# Patient Record
Sex: Male | Born: 1935 | Race: White | Hispanic: No | Marital: Married
Health system: Southern US, Community
[De-identification: ages and names within clinical notes are randomized; demographics above are authoritative.]

---

## 2006-02-08 ENCOUNTER — Emergency Department (HOSPITAL_COMMUNITY): Admission: EM | Admit: 2006-02-08 | Discharge: 2006-02-09 | Payer: Self-pay | Admitting: Emergency Medicine

## 2006-02-16 ENCOUNTER — Ambulatory Visit: Payer: Self-pay | Admitting: Urology

## 2006-02-16 ENCOUNTER — Other Ambulatory Visit: Payer: Self-pay

## 2006-02-17 ENCOUNTER — Ambulatory Visit: Payer: Self-pay | Admitting: Urology

## 2011-10-22 ENCOUNTER — Ambulatory Visit: Payer: Self-pay | Admitting: Cardiology

## 2011-10-22 LAB — PROTIME-INR: Prothrombin Time: 20.2 secs — ABNORMAL HIGH (ref 11.5–14.7)

## 2011-11-22 ENCOUNTER — Observation Stay: Payer: Self-pay | Admitting: Cardiology

## 2011-11-22 LAB — CBC WITH DIFFERENTIAL/PLATELET
Basophil #: 0.1 x10 3/mm 3
Basophil %: 0.7 %
Eosinophil #: 0.1 x10 3/mm 3
Eosinophil %: 0.9 %
HCT: 38.3 % — ABNORMAL LOW
HGB: 12.7 g/dL — ABNORMAL LOW
Lymphocyte %: 20.7 %
Lymphs Abs: 1.6 x10 3/mm 3
MCH: 26.8 pg
MCHC: 33.2 g/dL
MCV: 81 fL
Monocyte #: 0.8 "x10 3/mm "
Monocyte %: 10.3 %
Neutrophil #: 5.3 x10 3/mm 3
Neutrophil %: 67.4 %
Platelet: 144 x10 3/mm 3 — ABNORMAL LOW
RBC: 4.74 x10 6/mm 3
RDW: 15.5 % — ABNORMAL HIGH
WBC: 7.9 x10 3/mm 3

## 2011-11-22 LAB — BASIC METABOLIC PANEL
Calcium, Total: 9.2 mg/dL (ref 8.5–10.1)
Co2: 33 mmol/L — ABNORMAL HIGH (ref 21–32)
Creatinine: 1.37 mg/dL — ABNORMAL HIGH (ref 0.60–1.30)
EGFR (African American): 57 — ABNORMAL LOW
EGFR (Non-African Amer.): 49 — ABNORMAL LOW
Glucose: 136 mg/dL — ABNORMAL HIGH (ref 65–99)
Potassium: 3.5 mmol/L (ref 3.5–5.1)
Sodium: 140 mmol/L (ref 136–145)

## 2011-11-22 LAB — PROTIME-INR: Prothrombin Time: 24.3 secs — ABNORMAL HIGH (ref 11.5–14.7)

## 2011-11-23 LAB — BASIC METABOLIC PANEL
Anion Gap: 9 (ref 7–16)
BUN: 24 mg/dL — ABNORMAL HIGH (ref 7–18)
Chloride: 103 mmol/L (ref 98–107)
Creatinine: 1.24 mg/dL (ref 0.60–1.30)
EGFR (African American): >60
EGFR (Non-African Amer.): 55 — ABNORMAL LOW

## 2011-11-23 LAB — PROTIME-INR: INR: 2.3

## 2012-04-29 ENCOUNTER — Ambulatory Visit: Payer: Self-pay | Admitting: Oncology

## 2012-05-16 ENCOUNTER — Ambulatory Visit: Payer: Self-pay | Admitting: Urology

## 2012-05-16 LAB — PROTIME-INR: INR: 1.3

## 2012-05-18 ENCOUNTER — Ambulatory Visit: Payer: Self-pay | Admitting: Urology

## 2012-05-18 LAB — PROTIME-INR: INR: 1.1

## 2012-05-19 ENCOUNTER — Inpatient Hospital Stay: Payer: Self-pay

## 2012-05-19 LAB — URINALYSIS, COMPLETE
Glucose,UR: NEGATIVE mg/dL (ref 0–75)
Ketone: NEGATIVE
Protein: 100
Specific Gravity: 1.013 (ref 1.003–1.030)

## 2012-05-19 LAB — COMPREHENSIVE METABOLIC PANEL
Alkaline Phosphatase: 69 U/L (ref 50–136)
Anion Gap: 9 (ref 7–16)
BUN: 26 mg/dL — ABNORMAL HIGH (ref 7–18)
Bilirubin,Total: 1.1 mg/dL — ABNORMAL HIGH (ref 0.2–1.0)
Calcium, Total: 8.3 mg/dL — ABNORMAL LOW (ref 8.5–10.1)
Co2: 26 mmol/L (ref 21–32)
EGFR (African American): 26 — ABNORMAL LOW
EGFR (Non-African Amer.): 22 — ABNORMAL LOW
SGPT (ALT): 16 U/L (ref 12–78)
Total Protein: 6.2 g/dL — ABNORMAL LOW (ref 6.4–8.2)

## 2012-05-19 LAB — CBC WITH DIFFERENTIAL/PLATELET
Basophil #: 0 10*3/uL (ref 0.0–0.1)
Basophil %: 0.1 %
Eosinophil #: 0 10*3/uL (ref 0.0–0.7)
HCT: 31.6 % — ABNORMAL LOW (ref 40.0–52.0)
Lymphocyte #: 1.3 10*3/uL (ref 1.0–3.6)
MCH: 29.6 pg (ref 26.0–34.0)
Monocyte %: 7.2 %
Neutrophil %: 82.9 %
RDW: 14.3 % (ref 11.5–14.5)

## 2012-05-19 LAB — PROTIME-INR: Prothrombin Time: 15.5 secs — ABNORMAL HIGH (ref 11.5–14.7)

## 2012-05-19 LAB — PHOSPHORUS: Phosphorus: 4 mg/dL (ref 2.5–4.9)

## 2012-05-20 ENCOUNTER — Ambulatory Visit: Payer: Self-pay | Admitting: Neurology

## 2012-05-20 ENCOUNTER — Ambulatory Visit: Payer: Self-pay

## 2012-05-20 LAB — BASIC METABOLIC PANEL
Anion Gap: 9 (ref 7–16)
Chloride: 101 mmol/L (ref 98–107)
Creatinine: 3.3 mg/dL — ABNORMAL HIGH (ref 0.60–1.30)
EGFR (African American): 20 — ABNORMAL LOW
Sodium: 133 mmol/L — ABNORMAL LOW (ref 136–145)

## 2012-05-20 LAB — CBC WITH DIFFERENTIAL/PLATELET
Eosinophil %: 0.3 %
HCT: 23.6 % — ABNORMAL LOW (ref 40.0–52.0)
HGB: 7.9 g/dL — ABNORMAL LOW (ref 13.0–18.0)
Lymphocyte %: 8.6 %
MCH: 28.9 pg (ref 26.0–34.0)
MCHC: 33.3 g/dL (ref 32.0–36.0)
MCV: 87 fL (ref 80–100)
RBC: 2.72 10*6/uL — ABNORMAL LOW (ref 4.40–5.90)
WBC: 14 10*3/uL — ABNORMAL HIGH (ref 3.8–10.6)

## 2012-05-20 LAB — URINE CULTURE

## 2012-05-20 LAB — HEMATOCRIT: HCT: 20.5 % — ABNORMAL LOW (ref 40.0–52.0)

## 2012-05-21 LAB — BASIC METABOLIC PANEL
Anion Gap: 6 — ABNORMAL LOW (ref 7–16)
Calcium, Total: 7.9 mg/dL — ABNORMAL LOW (ref 8.5–10.1)
Chloride: 104 mmol/L (ref 98–107)
Co2: 26 mmol/L (ref 21–32)
Creatinine: 2.45 mg/dL — ABNORMAL HIGH (ref 0.60–1.30)
EGFR (African American): 28 — ABNORMAL LOW
EGFR (Non-African Amer.): 24 — ABNORMAL LOW
Glucose: 110 mg/dL — ABNORMAL HIGH (ref 65–99)
Potassium: 4.3 mmol/L (ref 3.5–5.1)

## 2012-05-21 LAB — CBC WITH DIFFERENTIAL/PLATELET
Basophil #: 0 10*3/uL (ref 0.0–0.1)
Basophil %: 0.1 %
Eosinophil #: 0 10*3/uL (ref 0.0–0.7)
Eosinophil %: 0 %
HCT: 22.9 % — ABNORMAL LOW (ref 40.0–52.0)
Lymphocyte %: 7.7 %
MCH: 29.7 pg (ref 26.0–34.0)
MCV: 87 fL (ref 80–100)
Monocyte #: 1.7 x10 3/mm — ABNORMAL HIGH (ref 0.2–1.0)
Monocyte %: 11.4 %
Neutrophil #: 12.4 10*3/uL — ABNORMAL HIGH (ref 1.4–6.5)
Platelet: 86 10*3/uL — ABNORMAL LOW (ref 150–440)
RBC: 2.65 10*6/uL — ABNORMAL LOW (ref 4.40–5.90)
WBC: 15.3 10*3/uL — ABNORMAL HIGH (ref 3.8–10.6)

## 2012-05-21 LAB — PROTIME-INR: Prothrombin Time: 16.8 secs — ABNORMAL HIGH (ref 11.5–14.7)

## 2012-05-22 LAB — HEPATIC FUNCTION PANEL A (ARMC)
Albumin: 2.7 g/dL — ABNORMAL LOW (ref 3.4–5.0)
Bilirubin,Total: 1.6 mg/dL — ABNORMAL HIGH (ref 0.2–1.0)
SGOT(AST): 46 U/L — ABNORMAL HIGH (ref 15–37)
SGPT (ALT): 26 U/L (ref 12–78)
Total Protein: 6 g/dL — ABNORMAL LOW (ref 6.4–8.2)

## 2012-05-22 LAB — BASIC METABOLIC PANEL
Anion Gap: 5 — ABNORMAL LOW (ref 7–16)
BUN: 32 mg/dL — ABNORMAL HIGH (ref 7–18)
Calcium, Total: 8.1 mg/dL — ABNORMAL LOW (ref 8.5–10.1)
EGFR (Non-African Amer.): 32 — ABNORMAL LOW
Osmolality: 280 (ref 275–301)
Potassium: 4.5 mmol/L (ref 3.5–5.1)

## 2012-05-22 LAB — PROTIME-INR: Prothrombin Time: 16.5 secs — ABNORMAL HIGH (ref 11.5–14.7)

## 2012-05-22 LAB — CBC WITH DIFFERENTIAL/PLATELET
Basophil #: 0 10*3/uL (ref 0.0–0.1)
Basophil %: 0.1 %
Eosinophil #: 0 10*3/uL (ref 0.0–0.7)
Eosinophil %: 0 %
HGB: 8.1 g/dL — ABNORMAL LOW (ref 13.0–18.0)
HGB: 7.3 g/dL — ABNORMAL LOW (ref 13.0–18.0)
Lymphocyte %: 6.3 %
Lymphocyte %: 7.3 %
MCH: 31.3 pg (ref 26.0–34.0)
MCH: 29.3 pg (ref 26.0–34.0)
MCHC: 35.9 g/dL (ref 32.0–36.0)
MCHC: 33.5 g/dL (ref 32.0–36.0)
MCV: 87 fL (ref 80–100)
Monocyte %: 9.9 %
Monocyte %: 9.6 %
Neutrophil %: 83 %
Platelet: 114 10*3/uL — ABNORMAL LOW (ref 150–440)
RBC: 2.59 10*6/uL — ABNORMAL LOW (ref 4.40–5.90)
RBC: 2.5 10*6/uL — ABNORMAL LOW (ref 4.40–5.90)
RDW: 14.5 % (ref 11.5–14.5)

## 2012-05-22 LAB — IRON AND TIBC
Iron Saturation: 5 %
Unbound Iron-Bind.Cap.: 206 ug/dL

## 2012-05-22 LAB — FERRITIN: Ferritin (ARMC): 246 ng/mL (ref 8–388)

## 2012-05-22 LAB — FIBRIN DEGRADATION PROD.(ARMC ONLY): Fibrin Degradation Prod.: >10 ug/ml — ABNORMAL HIGH (ref 2.1–7.7)

## 2012-05-22 LAB — PROTEIN ELECTROPHORESIS(ARMC)

## 2012-05-22 LAB — LACTATE DEHYDROGENASE: LDH: 441 U/L — ABNORMAL HIGH (ref 85–241)

## 2012-05-23 LAB — CBC WITH DIFFERENTIAL/PLATELET
Basophil #: 0 10*3/uL (ref 0.0–0.1)
Eosinophil #: 0 10*3/uL (ref 0.0–0.7)
HGB: 8.6 g/dL — ABNORMAL LOW (ref 13.0–18.0)
Lymphocyte #: 1.4 10*3/uL (ref 1.0–3.6)
Lymphocyte %: 9.1 %
MCH: 29.8 pg (ref 26.0–34.0)
Neutrophil #: 12.5 10*3/uL — ABNORMAL HIGH (ref 1.4–6.5)
Neutrophil %: 80.3 %
Platelet: 118 10*3/uL — ABNORMAL LOW (ref 150–440)
RDW: 14.5 % (ref 11.5–14.5)
WBC: 15.6 10*3/uL — ABNORMAL HIGH (ref 3.8–10.6)

## 2012-05-23 LAB — PROTIME-INR
INR: 1.3
Prothrombin Time: 16.5 secs — ABNORMAL HIGH (ref 11.5–14.7)

## 2012-05-23 LAB — BASIC METABOLIC PANEL
Anion Gap: 4 — ABNORMAL LOW (ref 7–16)
BUN: 33 mg/dL — ABNORMAL HIGH (ref 7–18)
Chloride: 107 mmol/L (ref 98–107)
Osmolality: 285 (ref 275–301)
Sodium: 139 mmol/L (ref 136–145)

## 2012-05-23 LAB — HEMOGLOBIN: HGB: 9 g/dL — ABNORMAL LOW (ref 13.0–18.0)

## 2012-05-24 LAB — BASIC METABOLIC PANEL
Anion Gap: 3 — ABNORMAL LOW (ref 7–16)
Calcium, Total: 8.3 mg/dL — ABNORMAL LOW (ref 8.5–10.1)
Chloride: 109 mmol/L — ABNORMAL HIGH (ref 98–107)
Co2: 30 mmol/L (ref 21–32)
Creatinine: 1.69 mg/dL — ABNORMAL HIGH (ref 0.60–1.30)
EGFR (African American): 44 — ABNORMAL LOW
Glucose: 119 mg/dL — ABNORMAL HIGH (ref 65–99)
Potassium: 4.2 mmol/L (ref 3.5–5.1)

## 2012-05-24 LAB — CBC WITH DIFFERENTIAL/PLATELET
Basophil #: 0.1 10*3/uL (ref 0.0–0.1)
Eosinophil #: 0.1 10*3/uL (ref 0.0–0.7)
Eosinophil %: 0.8 %
HGB: 8.4 g/dL — ABNORMAL LOW (ref 13.0–18.0)
MCH: 29.4 pg (ref 26.0–34.0)
MCHC: 33.3 g/dL (ref 32.0–36.0)
Monocyte %: 9.8 %
Neutrophil %: 79.1 %
RBC: 2.87 10*6/uL — ABNORMAL LOW (ref 4.40–5.90)
WBC: 13.9 10*3/uL — ABNORMAL HIGH (ref 3.8–10.6)

## 2012-05-24 LAB — CULTURE, BLOOD (SINGLE)

## 2012-05-24 LAB — HEMOGLOBIN: HGB: 9.7 g/dL — ABNORMAL LOW (ref 13.0–18.0)

## 2012-05-25 LAB — HEMOGLOBIN: HGB: 9.1 g/dL — ABNORMAL LOW (ref 13.0–18.0)

## 2012-05-25 LAB — CBC WITH DIFFERENTIAL/PLATELET
Basophil #: 0 10*3/uL (ref 0.0–0.1)
Eosinophil %: 0 %
HCT: 26 % — ABNORMAL LOW (ref 40.0–52.0)
HGB: 8.6 g/dL — ABNORMAL LOW (ref 13.0–18.0)
Lymphocyte #: 0.5 10*3/uL — ABNORMAL LOW (ref 1.0–3.6)
Lymphocyte %: 3.9 %
MCH: 29.2 pg (ref 26.0–34.0)
MCHC: 33.1 g/dL (ref 32.0–36.0)
Monocyte #: 0.4 x10 3/mm (ref 0.2–1.0)
Neutrophil #: 11.8 10*3/uL — ABNORMAL HIGH (ref 1.4–6.5)
Neutrophil %: 92.5 %
Platelet: 165 10*3/uL (ref 150–440)
RBC: 2.95 10*6/uL — ABNORMAL LOW (ref 4.40–5.90)
RDW: 14.7 % — ABNORMAL HIGH (ref 11.5–14.5)
WBC: 12.7 10*3/uL — ABNORMAL HIGH (ref 3.8–10.6)

## 2012-05-25 LAB — BASIC METABOLIC PANEL
Anion Gap: 3 — ABNORMAL LOW (ref 7–16)
BUN: 37 mg/dL — ABNORMAL HIGH (ref 7–18)
Co2: 31 mmol/L (ref 21–32)
Creatinine: 1.51 mg/dL — ABNORMAL HIGH (ref 0.60–1.30)
EGFR (African American): 50 — ABNORMAL LOW
EGFR (Non-African Amer.): 43 — ABNORMAL LOW
Osmolality: 298 (ref 275–301)
Potassium: 4.4 mmol/L (ref 3.5–5.1)

## 2012-05-26 LAB — CBC WITH DIFFERENTIAL/PLATELET
Basophil %: 0 %
Lymphocyte #: 0.6 10*3/uL — ABNORMAL LOW (ref 1.0–3.6)
MCH: 29.8 pg (ref 26.0–34.0)
MCHC: 33.7 g/dL (ref 32.0–36.0)
Neutrophil #: 17.5 10*3/uL — ABNORMAL HIGH (ref 1.4–6.5)
Neutrophil %: 93.4 %
Platelet: 201 10*3/uL (ref 150–440)
RDW: 14.5 % (ref 11.5–14.5)

## 2012-05-26 LAB — PROTEIN / CREATININE RATIO, URINE
Creatinine, Urine: 90.2 mg/dL (ref 30.0–125.0)
Protein, Random Urine: 82 mg/dL — ABNORMAL HIGH (ref 0–12)

## 2012-05-26 LAB — BASIC METABOLIC PANEL
Anion Gap: 4 — ABNORMAL LOW (ref 7–16)
Calcium, Total: 8.4 mg/dL — ABNORMAL LOW (ref 8.5–10.1)
Chloride: 103 mmol/L (ref 98–107)
Co2: 31 mmol/L (ref 21–32)
Glucose: 203 mg/dL — ABNORMAL HIGH (ref 65–99)
Sodium: 138 mmol/L (ref 136–145)

## 2012-05-27 LAB — CBC WITH DIFFERENTIAL/PLATELET
Basophil #: 0 10*3/uL (ref 0.0–0.1)
HGB: 9.7 g/dL — ABNORMAL LOW (ref 13.0–18.0)
Lymphocyte #: 0.5 10*3/uL — ABNORMAL LOW (ref 1.0–3.6)
MCH: 28.9 pg (ref 26.0–34.0)
MCV: 89 fL (ref 80–100)
Monocyte #: 0.8 x10 3/mm (ref 0.2–1.0)
Monocyte %: 5.4 %
Neutrophil #: 14.1 10*3/uL — ABNORMAL HIGH (ref 1.4–6.5)
RDW: 14.5 % (ref 11.5–14.5)
WBC: 15.4 10*3/uL — ABNORMAL HIGH (ref 3.8–10.6)

## 2012-05-27 LAB — BASIC METABOLIC PANEL
Anion Gap: 3 — ABNORMAL LOW (ref 7–16)
BUN: 41 mg/dL — ABNORMAL HIGH (ref 7–18)
Chloride: 102 mmol/L (ref 98–107)
Co2: 33 mmol/L — ABNORMAL HIGH (ref 21–32)
Creatinine: 1.31 mg/dL — ABNORMAL HIGH (ref 0.60–1.30)
EGFR (Non-African Amer.): 51 — ABNORMAL LOW
Glucose: 190 mg/dL — ABNORMAL HIGH (ref 65–99)
Potassium: 4.4 mmol/L (ref 3.5–5.1)
Sodium: 138 mmol/L (ref 136–145)

## 2012-05-28 LAB — BASIC METABOLIC PANEL
BUN: 39 mg/dL — ABNORMAL HIGH (ref 7–18)
Chloride: 101 mmol/L (ref 98–107)
Co2: 35 mmol/L — ABNORMAL HIGH (ref 21–32)
Creatinine: 1.23 mg/dL (ref 0.60–1.30)
EGFR (African American): >60
Glucose: 135 mg/dL — ABNORMAL HIGH (ref 65–99)
Osmolality: 287 (ref 275–301)
Potassium: 4.3 mmol/L (ref 3.5–5.1)
Sodium: 138 mmol/L (ref 136–145)

## 2012-05-28 LAB — CBC WITH DIFFERENTIAL/PLATELET
Basophil #: 0 10*3/uL (ref 0.0–0.1)
Basophil %: 0 %
Eosinophil #: 0 10*3/uL (ref 0.0–0.7)
HCT: 31.2 % — ABNORMAL LOW (ref 40.0–52.0)
Lymphocyte #: 1 10*3/uL (ref 1.0–3.6)
MCV: 89 fL (ref 80–100)
Monocyte #: 1.7 x10 3/mm — ABNORMAL HIGH (ref 0.2–1.0)
Neutrophil %: 85.7 %
RBC: 3.5 10*6/uL — ABNORMAL LOW (ref 4.40–5.90)
RDW: 14.5 % (ref 11.5–14.5)

## 2012-05-29 LAB — UR PROT ELECTROPHORESIS, URINE RANDOM

## 2012-05-30 ENCOUNTER — Ambulatory Visit: Payer: Self-pay | Admitting: Oncology

## 2012-08-15 ENCOUNTER — Observation Stay: Payer: Self-pay | Admitting: Cardiology

## 2012-08-15 LAB — CBC WITH DIFFERENTIAL/PLATELET
Basophil %: 0.8 %
HCT: 33.3 % — ABNORMAL LOW (ref 40.0–52.0)
HGB: 11.2 g/dL — ABNORMAL LOW (ref 13.0–18.0)
Lymphocyte #: 1.1 10*3/uL (ref 1.0–3.6)
MCH: 28.9 pg (ref 26.0–34.0)
MCHC: 33.8 g/dL (ref 32.0–36.0)
MCV: 86 fL (ref 80–100)
Monocyte #: 0.8 x10 3/mm (ref 0.2–1.0)
Neutrophil #: 6.1 10*3/uL (ref 1.4–6.5)
Platelet: 162 10*3/uL (ref 150–440)
RBC: 3.89 10*6/uL — ABNORMAL LOW (ref 4.40–5.90)
WBC: 8.1 10*3/uL (ref 3.8–10.6)

## 2012-08-15 LAB — PROTIME-INR: INR: 2.6

## 2012-08-15 LAB — BASIC METABOLIC PANEL
Creatinine: 1.4 mg/dL — ABNORMAL HIGH (ref 0.60–1.30)
EGFR (African American): 55 — ABNORMAL LOW
EGFR (Non-African Amer.): 47 — ABNORMAL LOW
Osmolality: 280 (ref 275–301)
Potassium: 3.2 mmol/L — ABNORMAL LOW (ref 3.5–5.1)
Sodium: 138 mmol/L (ref 136–145)

## 2012-08-16 LAB — PROTIME-INR: Prothrombin Time: 29.8 secs — ABNORMAL HIGH (ref 11.5–14.7)

## 2012-11-21 ENCOUNTER — Ambulatory Visit: Payer: Self-pay | Admitting: Cardiology

## 2012-12-05 ENCOUNTER — Ambulatory Visit: Payer: Self-pay | Admitting: Urology

## 2013-01-17 ENCOUNTER — Ambulatory Visit: Payer: Self-pay | Admitting: Specialist

## 2013-01-17 LAB — BODY FLUID CELL COUNT WITH DIFFERENTIAL
Eosinophil: 0 %
Lymphocytes: 56 %
Other Mononuclear Cells: 38 %

## 2013-01-21 LAB — BODY FLUID CULTURE

## 2013-02-01 ENCOUNTER — Ambulatory Visit: Payer: Self-pay | Admitting: Specialist

## 2013-02-07 LAB — CULTURE, FUNGUS WITHOUT SMEAR

## 2013-02-14 ENCOUNTER — Ambulatory Visit: Payer: Self-pay | Admitting: Urology

## 2013-03-13 ENCOUNTER — Ambulatory Visit: Payer: Self-pay | Admitting: Unknown Physician Specialty

## 2013-03-19 ENCOUNTER — Inpatient Hospital Stay: Payer: Self-pay

## 2013-03-19 LAB — BASIC METABOLIC PANEL
ANION GAP: 2 — AB (ref 7–16)
BUN: 24 mg/dL — AB (ref 7–18)
CO2: 34 mmol/L — AB (ref 21–32)
Calcium, Total: 8.8 mg/dL (ref 8.5–10.1)
Chloride: 96 mmol/L — ABNORMAL LOW (ref 98–107)
Creatinine: 1.65 mg/dL — ABNORMAL HIGH (ref 0.60–1.30)
EGFR (African American): 45 — ABNORMAL LOW
EGFR (Non-African Amer.): 39 — ABNORMAL LOW
Glucose: 108 mg/dL — ABNORMAL HIGH (ref 65–99)
Osmolality: 269 (ref 275–301)
POTASSIUM: 3.7 mmol/L (ref 3.5–5.1)
Sodium: 132 mmol/L — ABNORMAL LOW (ref 136–145)

## 2013-03-19 LAB — CBC
HCT: 36.1 % — ABNORMAL LOW (ref 40.0–52.0)
HGB: 11.8 g/dL — ABNORMAL LOW (ref 13.0–18.0)
MCH: 28.3 pg (ref 26.0–34.0)
MCHC: 32.6 g/dL (ref 32.0–36.0)
MCV: 87 fL (ref 80–100)
Platelet: 106 10*3/uL — ABNORMAL LOW (ref 150–440)
RBC: 4.16 10*6/uL — AB (ref 4.40–5.90)
RDW: 15.1 % — AB (ref 11.5–14.5)
WBC: 13.8 10*3/uL — ABNORMAL HIGH (ref 3.8–10.6)

## 2013-03-19 LAB — TROPONIN I
TROPONIN-I: 0.03 ng/mL
TROPONIN-I: 0.02 ng/mL
Troponin-I: 0.03 ng/mL

## 2013-03-19 LAB — PRO B NATRIURETIC PEPTIDE: B-Type Natriuretic Peptide: 6762 pg/mL — ABNORMAL HIGH (ref 0–450)

## 2013-03-20 LAB — CBC WITH DIFFERENTIAL/PLATELET
BASOS ABS: 0 10*3/uL (ref 0.0–0.1)
Basophil %: 0.2 %
EOS PCT: 0.9 %
Eosinophil #: 0.1 10*3/uL (ref 0.0–0.7)
HCT: 37 % — ABNORMAL LOW (ref 40.0–52.0)
HGB: 12.4 g/dL — AB (ref 13.0–18.0)
Lymphocyte #: 0.9 10*3/uL — ABNORMAL LOW (ref 1.0–3.6)
Lymphocyte %: 7 %
MCH: 28.9 pg (ref 26.0–34.0)
MCHC: 33.4 g/dL (ref 32.0–36.0)
MCV: 86 fL (ref 80–100)
MONO ABS: 1.2 x10 3/mm — AB (ref 0.2–1.0)
MONOS PCT: 9.2 %
NEUTROS ABS: 10.9 10*3/uL — AB (ref 1.4–6.5)
NEUTROS PCT: 82.7 %
PLATELETS: 120 10*3/uL — AB (ref 150–440)
RBC: 4.29 10*6/uL — AB (ref 4.40–5.90)
RDW: 14.8 % — AB (ref 11.5–14.5)
WBC: 13.2 10*3/uL — ABNORMAL HIGH (ref 3.8–10.6)

## 2013-03-20 LAB — BASIC METABOLIC PANEL
Anion Gap: 6 — ABNORMAL LOW (ref 7–16)
BUN: 23 mg/dL — AB (ref 7–18)
CALCIUM: 9 mg/dL (ref 8.5–10.1)
Chloride: 97 mmol/L — ABNORMAL LOW (ref 98–107)
Co2: 29 mmol/L (ref 21–32)
Creatinine: 1.15 mg/dL (ref 0.60–1.30)
EGFR (African American): >60
EGFR (Non-African Amer.): >60
GLUCOSE: 117 mg/dL — AB (ref 65–99)
OSMOLALITY: 269 (ref 275–301)
Potassium: 3.2 mmol/L — ABNORMAL LOW (ref 3.5–5.1)
Sodium: 132 mmol/L — ABNORMAL LOW (ref 136–145)

## 2013-03-20 LAB — LIPID PANEL
CHOLESTEROL: 133 mg/dL (ref 0–200)
HDL: 60 mg/dL (ref 40–60)
Ldl Cholesterol, Calc: 61 mg/dL (ref 0–100)
TRIGLYCERIDES: 60 mg/dL (ref 0–200)
VLDL Cholesterol, Calc: 12 mg/dL (ref 5–40)

## 2013-03-20 LAB — MAGNESIUM: Magnesium: 2.1 mg/dL

## 2013-03-20 LAB — TSH: Thyroid Stimulating Horm: 1.21 u[IU]/mL

## 2013-03-21 LAB — BASIC METABOLIC PANEL
Anion Gap: 10 (ref 7–16)
BUN: 27 mg/dL — AB (ref 7–18)
CHLORIDE: 98 mmol/L (ref 98–107)
CO2: 30 mmol/L (ref 21–32)
CREATININE: 1.22 mg/dL (ref 0.60–1.30)
Calcium, Total: 9.1 mg/dL (ref 8.5–10.1)
EGFR (African American): >60
EGFR (Non-African Amer.): 56 — ABNORMAL LOW
Glucose: 112 mg/dL — ABNORMAL HIGH (ref 65–99)
Osmolality: 282 (ref 275–301)
Potassium: 3.4 mmol/L — ABNORMAL LOW (ref 3.5–5.1)
Sodium: 138 mmol/L (ref 136–145)

## 2013-03-22 LAB — BASIC METABOLIC PANEL
Anion Gap: 1 — ABNORMAL LOW (ref 7–16)
BUN: 27 mg/dL — ABNORMAL HIGH (ref 7–18)
CREATININE: 1.24 mg/dL (ref 0.60–1.30)
Calcium, Total: 9.4 mg/dL (ref 8.5–10.1)
Chloride: 98 mmol/L (ref 98–107)
Co2: 35 mmol/L — ABNORMAL HIGH (ref 21–32)
GFR CALC NON AF AMER: 55 — AB
GLUCOSE: 108 mg/dL — AB (ref 65–99)
Osmolality: 274 (ref 275–301)
Potassium: 3.8 mmol/L (ref 3.5–5.1)
Sodium: 134 mmol/L — ABNORMAL LOW (ref 136–145)

## 2013-03-22 LAB — MAGNESIUM: Magnesium: 2 mg/dL

## 2013-03-26 LAB — EXPECTORATED SPUTUM ASSESSMENT W GRAM STAIN, RFLX TO RESP C

## 2014-06-21 NOTE — Consult Note (Signed)
Chief Complaint:  Subjective/Chief Complaint Patient admitted for AMS/hypotension.  Patient had Left ESWL earlier this week. Patient complains of left flank pain today.  Urinating without difficulty   VITAL SIGNS/ANCILLARY NOTES: **Vital Signs.:   22-Mar-14 04:17  Vital Signs Type Routine  Temperature Temperature (F) 97.9  Celsius 36.6  Temperature Source axillary  Pulse Pulse 102  Pulse source if not from Vital Sign Device per cardiac monitor  Respirations Respirations 17  Systolic BP Systolic BP 382  Diastolic BP (mmHg) Diastolic BP (mmHg) 77  Mean BP 103  Pulse Ox % Pulse Ox % 92  Pulse Ox Activity Level  At rest  Oxygen Delivery Room Air/ 21 %  Pulse Ox Heart Rate 94    08:00  Vital Signs Type Routine  Temperature Temperature (F) 98.1  Celsius 36.7  Temperature Source oral  Pulse Pulse 112  Pulse source if not from Vital Sign Device per cardiac monitor  Respirations Respirations 18  Systolic BP Systolic BP 505  Diastolic BP (mmHg) Diastolic BP (mmHg) 77  Mean BP 91  Pulse Ox % Pulse Ox % 93  Pulse Ox Activity Level  At rest  Oxygen Delivery Room Air/ 21 %  Pulse Ox Heart Rate 120    12:54  Vital Signs Type Routine  Temperature Temperature (F) 99.9  Celsius 37.7  Temperature Source oral  Pulse Pulse 80  Pulse source if not from Vital Sign Device per cardiac monitor  Respirations Respirations 20  Systolic BP Systolic BP 397  Diastolic BP (mmHg) Diastolic BP (mmHg) 64  Mean BP 80  Pulse Ox % Pulse Ox % 94  Pulse Ox Activity Level  At rest  Oxygen Delivery Room Air/ 21 %  Pulse Ox Heart Rate 120  *Intake and Output.:   22-Mar-14 01:36  Grand Totals Intake:   Output:  250    Net:  -250 24 Hr.:  -550  Urine ml     Out:  250  Urinary Method  Void; Urinal    06:13  Grand Totals Intake:  1100 Output:      Net:  1100 24 Hr.:  550  IV (Primary)      In:  1100    Shift 07:00  Grand Totals Intake:  1100 Output:  250    Net:  850 24 Hr.:  550  IV  (Primary)      In:  1100  Urine ml     Out:  250  Length of Stay Totals Intake:  1100 Output:  550    Net:  550    Daily 07:00  Grand Totals Intake:  1100 Output:  550    Net:  550 24 Hr.:  550  IV (Primary)      In:  1100  Urine ml     Out:  550  Length of Stay Totals Intake:  1100 Output:  550    Net:  550   Brief Assessment:  Respiratory normal resp effort   Gastrointestinal Normal   Gastrointestinal details normal No rebound tenderness  No gaurding   Additional Physical Exam Left CVA tenderness   Lab Results: LabObservation:  21-Mar-14 10:29   OBSERVATION Reason for Test  Hepatic:  21-Mar-14 00:48   Bilirubin, Total  1.1  Alkaline Phosphatase 69  SGPT (ALT) 16  SGOT (AST) 21  Total Protein, Serum  6.2  Albumin, Serum  3.2  Routine Micro:  21-Mar-14 02:35   Micro Text Report BLOOD CULTURE   COMMENT  NO GROWTH IN 18-24 HOURS   ANTIBIOTIC                       Culture Comment NO GROWTH IN 18-24 HOURS  Result(s) reported on 20 May 2012 at 06:48AM.    02:46   Micro Text Report BLOOD CULTURE   COMMENT                   NO GROWTH IN 18-24 HOURS   ANTIBIOTIC                       Culture Comment NO GROWTH IN 18-24 HOURS  Result(s) reported on 20 May 2012 at 06:49AM.    03:56   Micro Text Report URINE CULTURE   COMMENT                   NO GROWTH IN 18-24 HOURS   ANTIBIOTIC                       Specimen Source CLEAN CATCH  Culture Comment NO GROWTH IN 18-24 HOURS  Result(s) reported on 20 May 2012 at 11:30AM.  Lab:  21-Mar-14 03:36   pH (ABG) 7.40  PCO2 41  PO2  63  FiO2 21  Base Excess 0.5  HCO3 25.4  O2 Saturation 93.1  O2 Device room air  Specimen Site (ABG) RT RADIAL  Specimen Type (ABG) ARTERIAL  Patient Temp (ABG) 37.0 (Result(s) reported on 19 May 2012 at 03:38AM.)  General Ref:  21-Mar-14 18:49   Parathyroid Hormone, Intact ========== TEST NAME ==========  ========= RESULTS =========  = REFERENCE RANGE =  PTH  INTACT  PTH, Intact PTH, Intact                     [   57 pg/mL             ]             [address]               [facility]            No: [id number]           [address], [place], [place] [address]           Lindon Romp, MD         [phone]   Result(s) reported on 20 May 2012 at 08:18AM.  Protein Electrophoresis, Serum ========== TEST NAME ==========  ========= RESULTS =========  = REFERENCE RANGE =  PROTEIN ELECTROPHORESIS   Vitamin D, 25-Hydroxy, Serum ========== TEST NAME ==========  ========= RESULTS =========  = REFERENCE RANGE =  VITAMIN D  Vitamin D, 25-Hydroxy Vitamin D, 25-Hydroxy           [L  26.4 ng/mL           ]        30.0-100.0 Vitamin D deficiency has been defined by the [place] practice guideline as a level of serum 25-OH vitamin D less than 20 ng/mL (1,2). The Endocrine Society went on to further define vitamin D insufficiency as a level between 21 and 29 ng/mL (2). 1. IOM (Institute of Medicine). 2010. Dietary reference    intakes for calcium and D. [place]: The    [employer]. 2. Holick MF, Binkley Shively, Bischoff-Ferrari HA, et al.    Evaluation, treatment,  and prevention of vitamin D    deficiency: an Endocrine Society clinical practice    guideline. JCEM. 2011 Jul; 96(7):1911-30.               [facility]            No: [id number]           [address] [place],  [address]           Lindon Romp, MD         [phone]   Result(s) reported on 20 May 2012 at 07:18AM.  Cardiology:  21-Mar-14 00:48   Ventricular Rate 61  Atrial Rate 61  P-R Interval 208  QRS Duration 92  QT 490  QTc 493  P Axis 11  R Axis 24  T Axis 88  ECG interpretation Normal sinus rhythm Nonspecific T wave abnormality Prolonged QT Abnormal ECG When compared with ECG of 16-May-2012 13:20, No significant change was found ----------unconfirmed---------- Confirmed by  OVERREAD, NOT ([id number]), editor PEARSON, [id number]) on 05/19/2012 12:39:54 PM    10:29   Echo Doppler REASON FOR EXAM:     COMMENTS:     PROCEDURE: [facility] - ECHO DOPPLER COMPLETE  - May 19 2012 10:29AM   RESULT: Echocardiogram Report  Patient Name:   Jesus Grimes Date of Exam: 05/19/2012 Medical Rec #:  [id number]       Custom1: Date of Birth:  Nov 16, 1931    Height:       70.1 in Patient Age:    81 years     Weight:       244.7 lb Patient Gender: M            BSA:          2.28 m??  Indications: CVA/AS/Murmur Sonographer:    [place] Referring Phys: [place], [place]  Summary:  1. Left ventricular ejection fraction, by visual estimation, is 55 to  60%.  2. Normal global left ventricular systolic function.  3. Impaired relaxation pattern of LV diastolic filling.  4. Mildly increased left ventricular septal thickness.  5. Normal right ventricular size and systolic function.  6. Moderately dilated left atrium.  7. The right atrium is normal in size and structure.  8. Mild mitral valve regurgitation.  9. Thickening of the anterior and posterior mitral valve leaflets. 10. Mild to moderate aortic valve sclerosis/calcification without any  evidence of aortic stenosis. 11. The pulmonic valve is trileaflet, without evidence of stenosis or  insufficiency. 12. Mild to moderate tricuspid regurgitation. 13. Moderatelyincreased left ventricular posterior wall thickness. 14. Thickened tricuspid valve. 2D AND M-MODE MEASUREMENTS (normal ranges within parentheses): Left Ventricle:          Normal IVSd (2D):      1.51 cm (0.7-1.1) LVPWd (2D):     1.41 cm (0.7-1.1) Aorta/LA:                  Normal LVIDd (2D):     4.50 cm (3.4-5.7) Aortic Root (2D): 3.80 cm (2.4-3.7) LVIDs (2D):     2.65 cm           Left Atrium (2D): 5.60 cm (1.9-4.0) LV FS (2D):     41.1 %   (>25%) LV EF (2D):     72.1 %   (>50%)                            Right Ventricle:  RVd (2D):         2.87 cm LV DIASTOLIC FUNCTION: MV Peak E: 0.76 m/s E/e' Ratio: 12.70 MV Peak A: 1.20 m/s Decel Time: 363 msec E/A Ratio: 0.63 SPECTRAL DOPPLER ANALYSIS (where applicable): Mitral Valve: MV P1/2 Time: 105.27 msec MV Area, PHT: 2.09 cm?? Aortic Valve: AoV Max Vel: 3.81 m/s AoV Peak PG: 58.1 mmHg AoV Mean PG:  36.3 mmHg LVOT Vmax: 0.99 m/s LVOT VTI: 0.234 m LVOT Diameter: 2.10 cm AoV Area, Vmax: 0.90 cm?? AoV Area, VTI: 0.95 cm?? AoV Area, Vmn: 0.98 cm?? Tricuspid Valve and PA/RV Systolic Pressure: TR Max Velocity: 2.00 m/s RA  Pressure: 5 mmHg RVSP/PASP: 20.9 mmHg Pulmonic Valve: PV Max Velocity: 1.11 m/s PV Max PG: 4.9 mmHg PV Mean PG:  PHYSICIAN INTERPRETATION: Left Ventricle: The left ventricular internal cavity size was normal. LV  septal wall thickness was mildly increased. LV posterior wall thickness  was moderately increased. Global LV systolic function was normal. Left  ventricular ejection fraction, by visual estimation, is 55 to 60%.  Spectral Doppler shows impaired relaxation pattern of LV diastolic  filling. Right Ventricle: Normal right ventricular size, wall thickness, and  systolic function. The right ventricular size is normal. Left Atrium: The left atrium is moderately dilated. Right Atrium: The right atrium is normal in size and structure. Pericardium: There is no evidence of pericardial effusion. Mitral Valve: There is thickening of the anterior and posterior mitral   valve leaflets. Mild mitral valve regurgitation is seen. Tricuspid Valve: The tricuspid valve is thickened. Mild to moderate  tricuspid regurgitation is visualized. The tricuspid regurgitant velocity  is 2.00 m/s, and with an assumed right atrial pressure of 5 mmHg, the  estimated right ventricular systolic pressure is normal at 20.9 mmHg. Aortic Valve: Mild to moderate aortic valve sclerosis/calcification is  present, without any evidence of aortic stenosis. Pulmonic Valve: Structurally normal  pulmonic valve, with normal leaflet  excursion.  [place] MD Electronically signed by Ada Lujean Amel MD Signature Date/Time: 05/19/2012/2:14:41 PM  *** Final *** IMPRESSION: .    Verified By: Yolonda Kida, M.D., MD  Routine Chem:  21-Mar-14 00:48   Glucose, Serum  189  BUN  26  Creatinine (comp)  2.63  Sodium, Serum  135  Potassium, Serum 4.9  Chloride, Serum 100  CO2, Serum 26  Calcium (Total), Serum  8.3  Anion Gap 9  Osmolality (calc) 280  eGFR (African American)  26  eGFR (Non-African American)  22 (eGFR values <73m/min/1.73 m2 may be an indication of chronic kidney disease (CKD). Calculated eGFR is useful in patients with stable renal function. The eGFR calculation will not be reliable in acutely ill patients when serum creatinine is changing rapidly. It is not useful in  patients on dialysis. The eGFR calculation may not be applicable to patients at the low and high extremes of body sizes, pregnant women, and vegetarians.)  Magnesium, Serum 2.1 (1.8-2.4 THERAPEUTIC RANGE: 4-7 mg/dL TOXIC: > 10 mg/dL  -----------------------)    18:49   Phosphorus, Serum 4.0 (Result(s) reported on 19 May 2012 at 07:14PM.)  22-Mar-14 03:57   Cholesterol, Serum 113  Triglycerides, Serum 107  HDL (INHOUSE) 41  VLDL Cholesterol Calculated 21  LDL Cholesterol Calculated 51 (Result(s) reported on 20 May 2012 at 05:46AM.)  Glucose, Serum 94  BUN  39  Creatinine (comp)  3.30  Sodium, Serum  133  Potassium, Serum 4.4  Chloride, Serum 101  CO2, Serum 23  Calcium (Total), Serum  8.2  Anion Gap 9  Osmolality (calc) 276  eGFR (African American)  20  eGFR (Non-African American)  17 (eGFR values <65m/min/1.73 m2 may be an indication of chronic kidney disease (CKD). Calculated eGFR is useful in patients with stable renal function. The eGFR calculation will not be reliable in acutely ill patients when serum creatinine is changing rapidly. It is not useful in   patients on dialysis. The eGFR calculation may not be applicable to patients at the low and high extremes of body sizes, pregnant women, and vegetarians.)  Result Comment POTASSIUM/MAGNESIUM - Slight hemolysis, interpret results with  - caution.  Result(s) reported on 20 May 2012 at 05:22AM.  Magnesium, Serum 2.3 (1.8-2.4 THERAPEUTIC RANGE: 4-7 mg/dL TOXIC: > 10 mg/dL  -----------------------)  Cardiac:  21-Mar-14 00:48   CK, Total 39  CPK-MB, Serum 1.1 (Result(s) reported on 19 May 2012 at 02:10AM.)  Routine UA:  21-Mar-14 03:56   Color (UA) Amber  Clarity (UA) Cloudy  Glucose (UA) Negative  Bilirubin (UA) Negative  Ketones (UA) Negative  Specific Gravity (UA) 1.013  Blood (UA) 3+  pH (UA) 6.0  Protein (UA) 100 mg/dL  Nitrite (UA) Negative  Leukocyte Esterase (UA) Negative (Result(s) reported on 19 May 2012 at 04:46AM.)  RBC (UA) 395 /HPF  WBC (UA) 5 /HPF  Bacteria (UA) TRACE  Epithelial Cells (UA) NONE SEEN  Budding Yeast (UA) PRESENT  Amorphous Crystal (UA) PRESENT (Result(s) reported on 19 May 2012 at 04:46AM.)  Routine Coag:  21-Mar-14 00:48   Prothrombin  15.5  INR 1.2 (INR reference interval applies to patients on anticoagulant therapy. A single INR therapeutic range for coumarins is not optimal for all indications; however, the suggested range for most indications is 2.0 - 3.0. Exceptions to the INR Reference Range may include: Prosthetic heart valves, acute myocardial infarction, prevention of myocardial infarction, and combinations of aspirin and anticoagulant. The need for a higher or lower target INR must be assessed individually. Reference: The Pharmacology and Management of the Vitamin K  antagonists: the seventh ACCP Conference on Antithrombotic and Thrombolytic Therapy. CJASNK.5397Sept:126 (3suppl): [id number] A HCT value >55% may artifactually increase the PT.  In one study,  the increase was an average of 25%. Reference:  "Effect on Routine and  Special Coagulation Testing Values of Citrate Anticoagulant Adjustment in Patients with High HCT Values." American Journal of Clinical Pathology 2006;126:400-405.)  22-Mar-14 03:57   Prothrombin  16.7  INR 1.4 (INR reference interval applies to patients on anticoagulant therapy. A single INR therapeutic range for coumarins is not optimal for all indications; however, the suggested range for most indications is 2.0 - 3.0. Exceptions to the INR Reference Range may include: Prosthetic heart valves, acute myocardial infarction, prevention of myocardial infarction, and combinations of aspirin and anticoagulant. The need for a higher or lower target INR must be assessed individually. Reference: The Pharmacology and Management of the Vitamin K  antagonists: the seventh ACCP Conference on Antithrombotic and Thrombolytic Therapy. CQBHAL.9379Sept:126 (3suppl): [id number] A HCT value >55% may artifactually increase the PT.  In one study,  the increase was an average of 25%. Reference:  "Effect on Routine and Special Coagulation Testing Values of Citrate Anticoagulant Adjustment in Patients with High HCT Values." American Journal of Clinical Pathology 2006;126:400-405.)  Routine Hem:  21-Mar-14 00:48   WBC (CBC)  13.6  RBC (CBC)  3.61  Hemoglobin (CBC)  10.7  Hematocrit (CBC)  31.6  Platelet Count (CBC) 152  MCV 88  MCH 29.6  MCHC 33.8  RDW 14.3  Neutrophil % 82.9  Lymphocyte % 9.8  Monocyte % 7.2  Eosinophil % 0.0  Basophil % 0.1  Neutrophil #  11.3  Lymphocyte # 1.3  Monocyte # 1.0  Eosinophil # 0.0  Basophil # 0.0 (Result(s) reported on 19 May 2012 at 01:44AM.)  22-Mar-14 03:57   WBC (CBC)  14.0  RBC (CBC)  2.72  Hemoglobin (CBC)  7.9  Hematocrit (CBC)  23.6  Platelet Count (CBC)  87  MCV 87  MCH 28.9  MCHC 33.3  RDW 14.1  Neutrophil % 78.1  Lymphocyte % 8.6  Monocyte % 12.7  Eosinophil % 0.3  Basophil % 0.3  Neutrophil #  11.0  Lymphocyte # 1.2  Monocyte #  1.8   Eosinophil # 0.0  Basophil # 0.0 (Result(s) reported on 20 May 2012 at [facility].)   Radiology Results: CT:    22-Mar-14 11:22, CT Abdomen and Pelvis Without Contrast  CT Abdomen and Pelvis Without Contrast   REASON FOR EXAM:    (1) diminished renal blood flow in left kidney post   ESWL evaluate for subcapsula  COMMENTS:       PROCEDURE: CT  - CT ABDOMEN AND PELVIS W0  - May 20 2012 11:22AM     RESULT: History: Decreased flow left kidney. Prior ESWL.    Comparison Study: Ultrasound kidneys 05/19/2012.    Findings: Standard nonenhanced CT obtained. Left pleural effusion.   Atelectasis lung bases. Small right pleural effusion. Liver normal.   Gallbladder nondistended. Pancreas is normal. There is a largeleft   perirenal hematoma measuring 13 x 12 x 11 cm. The hematoma displaces the   kidney medially and anteriorly. Left perirenal fluid is present. Left     urolithiasis appears present. Stone debris noted in bladder. Bladder   nondistended. Calcification prostate. Phleboliths. No bowel distention or   free air. Aorta normal in caliber. Report phoned to patient's physician.    IMPRESSION:  Extremely large left perirenal hematoma. Left urolithiasis.   Stone debris in bladder.        Verified By: Osa Craver, M.D., MD   Assessment/Plan:  Assessment/Plan:  Assessment Patient with multiple punctate cerebral infarcts on MRI.  Presented to ED with AMS and hypotensions s/p ESWL procedure on 05/18/12  Urology issues: 1.Patient with left perirenal hematoma s/p ESWL. 2.Decreased hct/hgb 3.Elevated creatinine   Plan 1. Conservative management of hematoma.  Would consider holding warfarin until hct/hgb is stable.  Discussed with Dr. Gilford Rile 2. Recheck hct/hgb to evaluate further bleeding from kidney 3. Creatinine most likely secondary to ATN; hematoma is compressing left kidney and may contribute to renal function Dr. Holley Raring of nephrology managing renal function   Electronic  Signatures: Margy Clarks (MD)  (Signed 22-Mar-14 14:23)  Authored: Chief Complaint, VITAL SIGNS/ANCILLARY NOTES, Brief Assessment, Lab Results, Radiology Results, Assessment/Plan   Last Updated: 22-Mar-14 14:23 by Margy Clarks (MD)

## 2014-06-21 NOTE — Consult Note (Signed)
Chief Complaint:  Subjective/Chief Complaint Patient admitted for AMS/hypotension.  Patient had Left ESWL earlier this week. 24 hr events:  patient transfused 2 units overnight confusion overnight This morning the patient is agitated and confused   VITAL SIGNS/ANCILLARY NOTES: **Vital Signs.:   23-Mar-14 01:45  Vital Signs Type Blood Transfusion Complete  Temperature Temperature (F) 99  Celsius 37.2  Temperature Source axillary  Pulse Pulse 108  Pulse source if not from Vital Sign Device per cardiac monitor  Respirations Respirations 22  Systolic BP Systolic BP 749  Diastolic BP (mmHg) Diastolic BP (mmHg) 94  Mean BP 112  Pulse Ox % Pulse Ox % 96  Pulse Ox Activity Level  At rest  Oxygen Delivery Room Air/ 21 %  Pulse Ox Heart Rate 108    02:39  Vital Signs Type Upon Transfer  Temperature Temperature (F) 98.9  Celsius 37.1  Temperature Source axillary  Pulse Pulse 105  Respirations Respirations 20  Systolic BP Systolic BP 449  Diastolic BP (mmHg) Diastolic BP (mmHg) 79  Mean BP 99  Pulse Ox % Pulse Ox % 96  Pulse Ox Activity Level  At rest  Oxygen Delivery Room Air/ 21 %    05:44  Vital Signs Type Routine  Temperature Temperature (F) 99.8  Celsius 37.6  Temperature Source oral  Pulse Pulse 114  Respirations Respirations 19  Systolic BP Systolic BP 675  Diastolic BP (mmHg) Diastolic BP (mmHg) 87  Mean BP 113  Pulse Ox % Pulse Ox % 93  Pulse Ox Activity Level  At rest  Oxygen Delivery Room Air/ 21 %    08:03  Pulse Ox % Pulse Ox % 92  Pulse Ox Activity Level  At rest  Oxygen Delivery Room Air/ 21 %  Pulse Ox After Adjustment (RN or RCP Only) % 97  Oxygen Delivery Adjusted To (RN or RCP Only)  2L; Nasal Cannula  *Intake and Output.:   23-Mar-14 08:00  Grand Totals Intake:  0 Output:      Net:  0 24 Hr.:  0  Oral Intake      In:  0  Unmeasured Intake  Few bites    09:00  Urinary Method  Void; Incontinent    10:01  Grand Totals Intake:   Output:  100  Net:  -100 24 Hr.:  -100  Urine ml     Out:  100  Urinary Method  Void; Urinal    Shift 15:00  Grand Totals Intake:   Output:  100    Net:  -100 24 Hr.:  -100  Oral Intake      In:  0  Urine ml     Out:  100  Length of Stay Totals Intake:  1100 Output:  650    Net:  450   Brief Assessment:  Respiratory wheezing   Gastrointestinal details normal Soft  Nontender   Additional Physical Exam Left flank with ecchymosis (may be related to lovenox injections)  Scrotum-ecchymosis, no erythema or induration Phallus-uncircumcised   Lab Results: Routine BB:  22-Mar-14 16:32   ABO Group + Rh Type A Positive  Antibody Screen NEGATIVE (Result(s) reported on 20 May 2012 at 05:42PM.)    16:53   Crossmatch Unit 1 Issued  Crossmatch Unit 2 Issued (Result(s) reported on 20 May 2012 at 10:41PM.)  Routine Chem:  22-Mar-14 03:57   Glucose, Serum 94  BUN  39  Creatinine (comp)  3.30  Sodium, Serum  133  Potassium, Serum 4.4  Chloride, Serum  101  CO2, Serum 23  Calcium (Total), Serum  8.2  Anion Gap 9  Osmolality (calc) 276  eGFR (African American)  20  eGFR (Non-African American)  17 (eGFR values <3mL/min/1.73 m2 may be an indication of chronic kidney disease (CKD). Calculated eGFR is useful in patients with stable renal function. The eGFR calculation will not be reliable in acutely ill patients when serum creatinine is changing rapidly. It is not useful in  patients on dialysis. The eGFR calculation may not be applicable to patients at the low and high extremes of body sizes, pregnant women, and vegetarians.)  Cholesterol, Serum 113  Triglycerides, Serum 107  HDL (INHOUSE) 41  VLDL Cholesterol Calculated 21  LDL Cholesterol Calculated 51 (Result(s) reported on 20 May 2012 at 05:46AM.)  Result Comment POTASSIUM/MAGNESIUM - Slight hemolysis, interpret results with  - caution.  Result(s) reported on 20 May 2012 at 05:22AM.  Magnesium, Serum 2.3 (1.8-2.4 THERAPEUTIC RANGE: 4-7  mg/dL TOXIC: > 10 mg/dL  -----------------------)  23-Mar-14 04:44   Glucose, Serum  110  BUN  39  Creatinine (comp)  2.45  Sodium, Serum 136  Potassium, Serum 4.3  Chloride, Serum 104  CO2, Serum 26  Calcium (Total), Serum  7.9  Anion Gap  6  Osmolality (calc) 282  eGFR (African American)  28  eGFR (Non-African American)  24 (eGFR values <76mL/min/1.73 m2 may be an indication of chronic kidney disease (CKD). Calculated eGFR is useful in patients with stable renal function. The eGFR calculation will not be reliable in acutely ill patients when serum creatinine is changing rapidly. It is not useful in  patients on dialysis. The eGFR calculation may not be applicable to patients at the low and high extremes of body sizes, pregnant women, and vegetarians.)  Routine Coag:  22-Mar-14 03:57   Prothrombin  16.7  INR 1.4 (INR reference interval applies to patients on anticoagulant therapy. A single INR therapeutic range for coumarins is not optimal for all indications; however, the suggested range for most indications is 2.0 - 3.0. Exceptions to the INR Reference Range may include: Prosthetic heart valves, acute myocardial infarction, prevention of myocardial infarction, and combinations of aspirin and anticoagulant. The need for a higher or lower target INR must be assessed individually. Reference: The Pharmacology and Management of the Vitamin K  antagonists: the seventh ACCP Conference on Antithrombotic and Thrombolytic Therapy. SPQZR.0076 Sept:126 (3suppl): N9146842. A HCT value >55% may artifactually increase the PT.  In one study,  the increase was an average of 25%. Reference:  "Effect on Routine and Special Coagulation Testing Values of Citrate Anticoagulant Adjustment in Patients with High HCT Values." American Journal of Clinical Pathology 2006;126:400-405.)  23-Mar-14 04:44   Prothrombin  16.8  INR 1.4 (INR reference interval applies to patients on anticoagulant  therapy. A single INR therapeutic range for coumarins is not optimal for all indications; however, the suggested range for most indications is 2.0 - 3.0. Exceptions to the INR Reference Range may include: Prosthetic heart valves, acute myocardial infarction, prevention of myocardial infarction, and combinations of aspirin and anticoagulant. The need for a higher or lower target INR must be assessed individually. Reference: The Pharmacology and Management of the Vitamin K  antagonists: the seventh ACCP Conference on Antithrombotic and Thrombolytic Therapy. AUQJF.3545 Sept:126 (3suppl): N9146842. A HCT value >55% may artifactually increase the PT.  In one study,  the increase was an average of 25%. Reference:  "Effect on Routine and Special Coagulation Testing Values of Citrate Anticoagulant Adjustment in Patients  with High HCT Values." American Journal of Clinical Pathology 0962;836:629-476.)  Routine Hem:  22-Mar-14 03:57   WBC (CBC)  14.0  RBC (CBC)  2.72  Hemoglobin (CBC)  7.9  Hematocrit (CBC)  23.6  Platelet Count (CBC)  87  MCV 87  MCH 28.9  MCHC 33.3  RDW 14.1  Neutrophil % 78.1  Lymphocyte % 8.6  Monocyte % 12.7  Eosinophil % 0.3  Basophil % 0.3  Neutrophil #  11.0  Lymphocyte # 1.2  Monocyte #  1.8  Eosinophil # 0.0  Basophil # 0.0 (Result(s) reported on 20 May 2012 at [facility].)    14:54   Hemoglobin (CBC)  6.8 (Result(s) reported on 20 May 2012 at 03:29PM.)  Hematocrit (CBC)  20.5 (Result(s) reported on 20 May 2012 at 03:29PM.)  23-Mar-14 04:44   WBC (CBC)  15.3  RBC (CBC)  2.65  Hemoglobin (CBC)  7.9  Hematocrit (CBC)  22.9  Platelet Count (CBC)  86  MCV 87  MCH 29.7  MCHC 34.3  RDW 14.3  Neutrophil % 80.8  Lymphocyte % 7.7  Monocyte % 11.4  Eosinophil % 0.0  Basophil % 0.1  Neutrophil #  12.4  Lymphocyte # 1.2  Monocyte #  1.7  Eosinophil # 0.0  Basophil # 0.0 (Result(s) reported on 21 May 2012 at 05:33AM.)   Radiology Results: CT:     22-Mar-14 11:22, CT Abdomen and Pelvis Without Contrast  CT Abdomen and Pelvis Without Contrast   REASON FOR EXAM:    (1) diminished renal blood flow in left kidney post   ESWL evaluate for subcapsula  COMMENTS:       PROCEDURE: CT  - CT ABDOMEN AND PELVIS W0  - May 20 2012 11:22AM     RESULT: History: Decreased flow left kidney. Prior ESWL.    Comparison Study: Ultrasound kidneys 05/19/2012.    Findings: Standard nonenhanced CT obtained. Left pleural effusion.   Atelectasis lung bases. Small right pleural effusion. Liver normal.   Gallbladder nondistended. Pancreas is normal. There is a largeleft   perirenal hematoma measuring 13 x 12 x 11 cm. The hematoma displaces the   kidney medially and anteriorly. Left perirenal fluid is present. Left     urolithiasis appears present. Stone debris noted in bladder. Bladder   nondistended. Calcification prostate. Phleboliths. No bowel distention or   free air. Aorta normal in caliber. Report phoned to patient's physician.    IMPRESSION:  Extremely large left perirenal hematoma. Left urolithiasis.   Stone debris in bladder.        Verified By: Osa Craver, M.D., MD   Assessment/Plan:  Assessment/Plan:  Assessment Patient with multiple punctate cerebral infarcts on MRI.  Presented to ED with AMS and hypotensions s/p ESWL procedure on 05/18/12.  Patient confused and agitated this morning.  Urology issues: 1.Patient with left perirenal hematoma s/p ESWL. 2.Decreased hct/hgb (31-->23-->20-->tranfused 2 units-->22.9) 3.Elevated creatinine   Plan 1. Conservative management of hematoma.  Continue to hold warfarin until hct/hgb is stable.  Correct coags. If patient continues to have evidence of bleeding will need to consider interventional radiology/vascular for possible embolization/coiling 2. Continue q6h hct/hgb checks and transfuse prn.   3. Creatinine is improving.  Dr. Holley Raring of nephrology managing renal function  AMS/respiratory  function per primary team   Electronic Signatures: Margy Clarks (MD)  (Signed 23-Mar-14 10:51)  Authored: Chief Complaint, VITAL SIGNS/ANCILLARY NOTES, Brief Assessment, Lab Results, Radiology Results, Assessment/Plan   Last Updated: 23-Mar-14 10:51 by Margy Clarks (  MD) 

## 2014-06-21 NOTE — H&P (Signed)
PATIENT NAME:  Jesus CraneCANNON, Michell MR#:  [id number] DATE OF BIRTH:  04-25-1931  DATE OF ADMISSION:  05/18/2012  PRIMARY CARE PHYSICIAN:  Dr. Sampson GoonFitzgerald.  REFERRING PHYSICIAN:  Dr. Arnoldo MoraleManley.   CHIEF COMPLAINT:  Altered mental status associated with expressive aphasia and unsteady gait.   HISTORY OF PRESENT ILLNESS:  The patient is a 81 year old male with a past medical history of chronic atrial fibrillation on Coumadin, chronic renal insufficiency and multiple other medical problems, has undergone lithotripsy by Dr. Evelene CroonWolff for kidney stones.  Following that he went home, but was groggy and confused.  He was having trouble in putting the words together and was off balance.  The patient was feeling weak and his blood pressure was at 78/46.  Daughter called Dr. Evelene CroonWolff who has recommended to give him Gatorade.  Though patient has been drinking a lot of Gatorade, his blood pressure was still low and at around 10 p.m. Dr. Evelene CroonWolff has recommended the patient to go to ER.  In the ER, as there was a concern for a stroke, the patient had CAT scan of the head done which has revealed chronic changes, but no acute findings.  Chest x-ray has revealed a left-sided pleural effusion with possible underlying left basilar infiltrate.  The patient's white count is elevated and renal function is worse with a creatinine of 2.2.  The patient has received IV fluids to support his blood pressure as patient was initially hypotensive.  Also, patient has received IV Rocephin for possible pneumonia.  Hospitalist team is called to admit the patient for further management.  The patient is off Coumadin for the past 7 days and Dr. Evelene CroonWolff has recommended him not to take Coumadin until Saturday as he just got lithotripsy done.  The patient denies any chest pain or tightness, but complaining of heavy breathing.  Denies any abdominal pain, nausea, vomiting, diarrhea.  The patient's speech is much better according to the family members during my examination.   His confusion is also getting better. The patient denies any nausea, vomiting, diarrhea. wife and daughter are at bedside.   PAST MEDICAL HISTORY:  Chronic atrial fibrillation on Coumadin, hypertension, hyperlipidemia, COPD, chronic kidney disease, hypothyroidism.   PAST SURGICAL HISTORY:  Coronary artery bypass grafting, lithotripsy, cardioversion for atrial fibrillation, skin cancer resection which was stage II.   ALLERGIES:  The patient is [facility].   HOME MEDICATIONS:  Coumadin 7.5 mg alternating with 5 mg; tamsulosin 0.4 mg once daily, pravastatin 80 mg once a day, ondansetron 8 mg once every 4 hours as needed, Ocuvite 1 tablet by mouth once daily, losartan 100 mg once a day, levothyroxine 125 mcg once a day, levofloxacin 250 mg every 24 hours, furosemide  injectable, Flonase nasal spray once a day, Betapace 80 mg 1 tablet 2 times a day, benazepril 1 tablet by mouth once daily, B12 2500 mg sublingually once a day, aspirin 81 mg once a day.   PSYCHOSOCIAL HISTORY:  Lives at home with wife.  He used to smoke, but quit smoking 30 years ago.  He drinks wine every day.  Denies any street drugs.   FAMILY HISTORY:  Both his parents were deceased at a young age and the etiology of the death was unclear.   REVIEW OF SYSTEMS:  CONSTITUTIONAL:  Complaining of fatigue.  Denies any fever, but complaining of weakness.  No pain, weight loss or weight gain.  EYES:  The patient has macular degeneration chronically.  No glaucoma or cataracts.  EARS, NOSE, THROAT:  Denies tinnitus, ear pain, snoring, postnasal drip.   RESPIRATION:  No coughing, but complaining of dyspnea.  Chronic history of COPD is present.  No hemoptysis.  CARDIOVASCULAR:  Denies any chest pain, palpitations, syncope.  GASTROINTESTINAL:  No nausea, vomiting, diarrhea or GERD.  GENITOURINARY:  No dysuria or  hematuria.   ENDOCRINE:  No polyuria or nocturia.  The patient has hypothyroidism.  HEMATOLOGIC AND LYMPHATIC:   No anemia or easy bruising.  INTEGUMENTARY:  No acne, rash, lesions.  The patient recently had a stage II skin cancer resection done.  MUSCULOSKELETAL:  Denies any pain in the shoulder, neck, back.  Denies any gout.  NEUROLOGIC:  No old history of  stroke, TIA.  Denies ataxia, dementia.  PSYCHIATRIC:  No ADD, OCD, bipolar disorder.   PHYSICAL EXAMINATION: VITAL SIGNS:  Temperature is not recorded, pulse 62, respirations 18, blood pressure 120/66, pulse ox 95%.  GENERAL APPEARANCE:  Not under acute distress.  Obese, moderately built.  HEENT:  Normocephalic, atraumatic.  Pupils are equally reacting to light and accommodation.  No scleral icterus.  No conjunctival injection.  Extraocular movements are intact.  Oral cavity, no exudates in the pharynx.  Moist mucous membranes.  No sinus tenderness.  NECK:  Supple.  No JVD.  No thyromegaly.  No lymphadenopathy.  LUNGS:  Decreased breath sounds left lower lobe.  No wheezing.  No anterior chest wall tenderness on palpation.  No accessory muscle usage.  CARDIOVASCULAR:  Irregularly irregular.  No gallops or murmurs.  GASTROINTESTINAL:  Soft, obese.  Bowel sounds are positive in all four quadrants.  Nontender, nondistended.  No masses felt.  NEUROLOGIC:  Awake, alert, oriented x 3 and answering questions appropriately, but hard of hearing.  Motor and sensory are grossly intact.  No cerebellar signs.  Cranial nerves II through XII are grossly intact.  No pronator drift.  SKIN:  No rashes, status post skin cancer stage II resection.  Warm to touch.  Normal skin turgor.  EXTREMITIES:  Trace edema is present.  No cyanosis.  No clubbing is present.  MUSCULOSKELETAL:  No joint effusion, tenderness, erythema.  PSYCHIATRIC:  Normal mood and affect.    LABORATORY AND IMAGING STUDIES:  CAT scan of the head, no acute findings.  Chest x-ray has revealed cardiomegaly with left lower lobe pulmonary edema with obliteration of CP angle, possible underlying infiltrate.   ABG, pH 7.40, pCO2 41, pO2 63, FiO2 21, base excess 0.5, bicarb is 20.4,  oxygen saturation 93.1.  Urinalysis, amber color, cloudy in appearance, glucose and bilirubin are negative.  Ketones are negative.  Specific gravity 1.013, leukocyte esterase and nitrites are negative.  PT 15.5, INR is 1.2.  WBC 13.6, hemoglobin 10.7, hematocrit 31.6, platelet count 152.  LFTs, total protein is low, serum albumin is low at 3.2, total bilirubin is 1.1.  Alkaline phosphatase, AST and ALT are within normal range.  Glucose 189, BUN 26, creatinine 2.63.  His baseline is at approximately 1.5.  Sodium 135, potassium 4.9, chloride 100, CO2 26, GFR 22, anion gap is 9, serum osmolality 280, calcium 8.3, magnesium 2.1.   ASSESSMENT AND PLAN:  A 81 year old Caucasian male presenting to the ER for being lethargic, having expressive aphasia and problems with the gait, will be admitted with the following assessment and plan.  1.  Possible stroke.  Plan:  CT head is negative.  We will get MRI of the brain, carotid Dopplers and 2-D echocardiogram.  We will get neuro checks.  Aspirin  is added to his regimen.  The patient's fasting lipid panel will be checked.   2.  Acute on chronic renal insufficiency.  We will provide him IV fluids at 100 mL/hour, avoid nephrotoxins, holding his blood pressure medications, ACE inhibitor and Cosartan.    3.  Chronic atrial fibrillation, currently rate-controlled.  Coumadin is on hold as the patient just had procedure lithotripsy yesterday, as recommended by Dr. Evelene Croon until Saturday.  4.  Hypotension.  Lasix is on hold.  5.  Left lower lobe pleural effusion with probably underlying pneumonia.  We will give him IV Rocephin and Zithromax and the rounding physician to consider repeating chest x-ray on Saturday.  We will put a consult to nephrology Dr. Cherylann Ratel regarding the worsening of the renal function.   6.  THE PATIENT IS FULL CODE.  Wife is his medical power of attorney.   The diagnosis and plan of  care was discussed with the patient, his wife and daughter at bedside.  They all verbalized understanding of the plan.   TOTAL TIME SPENT ON ADMISSION:  Is 60 minutes.   The patient will be turned over to Dr. Sampson Goon in a.m.     ____________________________ Ramonita Lab, MD ag:ea D: 05/19/2012 05:53:21 ET T: 05/19/2012 06:17:06 ET JOB#: [id number]  cc: Ramonita Lab, MD, <Dictator> Dr. Kinnie Feil MD ELECTRONICALLY SIGNED 05/20/2012 1:59

## 2014-06-21 NOTE — Consult Note (Signed)
Chief Complaint:  Subjective/Chief Complaint No flank pain.   VITAL SIGNS/ANCILLARY NOTES: **Vital Signs.:   26-Mar-14 02:13  Vital Signs Type Q 4hr  Temperature Temperature (F) 98.1  Celsius 36.7  Temperature Source oral  Pulse Pulse 117  Respirations Respirations 18  Systolic BP Systolic BP 170  Diastolic BP (mmHg) Diastolic BP (mmHg) 103  Mean BP 125  Pulse Ox % Pulse Ox % 99  Pulse Ox Activity Level  At rest  Oxygen Delivery 2L    02:27  Vital Signs Type Recheck  Systolic BP Systolic BP 142  Diastolic BP (mmHg) Diastolic BP (mmHg) 82  Mean BP 102  BP Source  if not from Vital Sign Device manual    04:04  Pulse Pulse 99  Telemetry pattern Cardiac Rhythm Atrial fibrillation; pattern reported by Telemetry Clerk    05:13  Vital Signs Type Routine  Temperature Temperature (F) 98.2  Celsius 36.7  Temperature Source oral  Pulse Pulse 88  Respirations Respirations 18  Systolic BP Systolic BP 176  Diastolic BP (mmHg) Diastolic BP (mmHg) 95  Mean BP 122  Pulse Ox % Pulse Ox % 97  Pulse Ox Activity Level  At rest  Oxygen Delivery 2L    05:30  Vital Signs Type Recheck  Systolic BP Systolic BP 142  Diastolic BP (mmHg) Diastolic BP (mmHg) 92  Mean BP 108  BP Source  if not from Vital Sign Device manual    09:15  Telemetry pattern Cardiac Rhythm Atrial fibrillation; pattern reported by Telemetry Clerk    09:40  Pulse Pulse 107  Pulse source if not from Vital Sign Device per Telemetry Clerk  Telemetry pattern Cardiac Rhythm Atrial fibrillation; pattern reported by Telemetry Clerk    14:17  Vital Signs Type Routine  Temperature Temperature (F) 98.1  Celsius 36.7  Temperature Source oral  Pulse Pulse 108  Respirations Respirations 26  Systolic BP Systolic BP 176  Diastolic BP (mmHg) Diastolic BP (mmHg) 88  Mean BP 117  BP Source  if not from Vital Sign Device manual  Pulse Ox % Pulse Ox % 98  Pulse Ox Activity Level  At rest  Oxygen Delivery 3L  *Intake and  Output.:   26-Mar-14 02:10  Grand Totals Intake:   Output:  50    Net:  -50 24 Hr.:  -310  Urine ml     Out:  50  Urinary Method  Void; Urinal    Shift 07:00  Grand Totals Intake:   Output:  150    Net:  -150 24 Hr.:  -310  Urine ml     Out:  150  Length of Stay Totals Intake:  3256 Output:  1550    Net:  1706    Daily 07:00  Grand Totals Intake:  240 Output:  550    Net:  -310 24 Hr.:  -310  Oral Intake      In:  240  Urine ml     Out:  550  Length of Stay Totals Intake:  3256 Output:  1550    Net:  1706    07:30  Grand Totals Intake:   Output:  150    Net:  -150 24 Hr.:  -150  Urine ml     Out:  150  Urinary Method  Void; Urinal; Diaper    09:55  Grand Totals Intake:   Output:  175    Net:  -175 24 Hr.:  -325  Urine ml     Out:  175  Urinary Method  Void; Urinal    10:00  Grand Totals Intake:   Output:      Net:   24 Hr.:  -325  Percentage of Meal Eaten  30    11:31  Grand Totals Intake:   Output:  100    Net:  -100 24 Hr.:  -425  Urine ml     Out:  100  Urinary Method  Void; Urinal    14:14  Grand Totals Intake:   Output:  100    Net:  -100 24 Hr.:  -525  Urine ml     Out:  100  Urinary Method  Void; Urinal    14:49  Grand Totals Intake:   Output:  115    Net:  -115 24 Hr.:  -640  Urine ml     Out:  115  Urinary Method  Void; Urinal    Shift 15:00  Grand Totals Intake:   Output:  640    Net:  -640 24 Hr.:  -640  Urine ml     Out:  640  Length of Stay Totals Intake:  3256 Output:  2190    Net:  1066   Brief Assessment:  Gastrointestinal details normal Soft  Nontender  No CVAT   Additional Physical Exam Less scrotal swelling. Ecchymosis improving.   Lab Results: Routine Chem:  21-Mar-14 00:48   Creatinine (comp)  2.63  22-Mar-14 03:57   Creatinine (comp)  3.30  23-Mar-14 04:44   Creatinine (comp)  2.45  24-Mar-14 04:36   Creatinine (comp)  1.92  25-Mar-14 04:36   Creatinine (comp)  1.95  26-Mar-14 04:59    Creatinine (comp)  1.69  Routine Hem:  21-Mar-14 00:48   Hematocrit (CBC)  31.6  22-Mar-14 03:57   Hematocrit (CBC)  23.6    14:54   Hematocrit (CBC)  20.5 (Result(s) reported on 20 May 2012 at 03:29PM.)  23-Mar-14 04:44   Hematocrit (CBC)  22.9  24-Mar-14 04:36   Hematocrit (CBC)  22.6    17:15   Hematocrit (CBC)  21.9  25-Mar-14 04:36   Hematocrit (CBC)  25.6  26-Mar-14 04:59   Hematocrit (CBC)  25.4   Assessment/Plan:  Assessment/Plan:  Assessment Hematocrit stable-no evidence of further renal bleeding. Creatinine improving.   Plan Continue to avoid anticoagulants due to risk of re-bleed. Follow-up in the office after discharge. Call if further assistance is needed.   Electronic Signatures: Orson ApeWolff, Forbes Loll R (MD)  (Signed 26-Mar-14 17:10)  Authored: Chief Complaint, VITAL SIGNS/ANCILLARY NOTES, Brief Assessment, Lab Results, Assessment/Plan   Last Updated: 26-Mar-14 17:10 by Orson ApeWolff, Dynasty Holquin R (MD)

## 2014-06-21 NOTE — Consult Note (Signed)
History of Present Illness:  Reason for Consult Anemia and thrombocytopenia.   HPI   Patient is a 60-year-ol who recently underwent lithotripsy for kidney stones.  After discharge he went home and became increasingly confused.  His blood pressure was also noted to be 78/46.  Patient subsequently went to the ER for concern of a CVA.  CT of head revealed multiple infarcts, possibly related to his underlying atrial fibrillation.  He was also noted to be acute renal failure and had a hematoma near the site of his lithotripsy.  The next several days recently became increasingly confused.  Currently he is lethargic and difficult to arous.  Much of the history is given by his daughter and wife.  Patient had a normal platelet count upon admission that subsequently decreased but has been stable for the past 3-4 days.  Patient was actively bleeding, but is unclear if this has subsided.    PFSH:  Additional Past Medical and Surgical History Atrial fibrillation previously on Coumadin, hypertension, hyperlipidemia, COPD, chronic kidney disease, hypothyroidism, CABG, lithotripsy, cardioversion, skin cancer resection.   Review of Systems:  Performance Status (ECOG) 4   Review of Systems   Unable to obtain  NURSING NOTES: **Vital Signs.:   24-Mar-14 15:54   Vital Signs Type: Q 4hr   Temperature Temperature (F): 100.8   Celsius: 38.2   Temperature Source: axillary   Pulse Pulse: 95   Pulse source if not from Vital Sign Device: per Telemetry Clerk   Respirations Respirations: 30   Systolic BP Systolic BP: 086   Diastolic BP (mmHg) Diastolic BP (mmHg): 81   Mean BP: 94   Pulse Ox % Pulse Ox %: 97   Oxygen Delivery: Room Air/ 21 %   Pulse Ox Heart Rate: 108   Telemetry pattern Cardiac Rhythm: Atrial fibrillation   Physical Exam:  Physical Exam General: Obese, no acute distress. Eyes: Pink conjunctiva, anicteric sclera. Lungs: Clear to auscultation bilaterally. Heart: Regular rate  and rhythm. No rubs, murmurs, or gallops. Abdomen: Soft, Obese, normoactive bowel sounds. Musculoskeletal: No edema, cyanosis, or clubbing. Neuro: Lethargic Skin: No rashes or petechiae noted.    Morphine: Agitation  Ambien: Hallucinations    furosemide: 10 milligram(s) injectable once, Status: Active, Quantity: 0, Refills: None   levofloxacin 250 mg oral tablet: 1 tab(s) orally every 24 hours, Status: Active, Quantity: 0, Refills: None   tapentadol 50 mg oral tablet: 1 tab(s) orally every 4 to 6 hours, As needed, pain, Status: Active, Quantity: 0, Refills: None   tamsulosin 0.4 mg oral capsule: 1 cap(s) orally once a day (after a meal), Status: Active, Quantity: 0, Refills: None   ondansetron 8 mg oral tablet, disintegrating: 1 tab(s) orally every 4 hours, As needed, nausea, vomiting, Status: Active, Quantity: 0, Refills: None   levothyroxine 125 mcg (0.125 mg) oral tablet: 1 tab(s) orally once a day, Status: Active, Quantity: 0, Refills: None   Aspir 81 oral delayed release tablet: 1 tab(s) orally once a day, Status: Active, Quantity: 0, Refills: None   Ocuvite oral tablet: 1 tab(s) orally once a day, Status: Active, Quantity: 0, Refills: None   pravastatin 80 mg oral tablet: 1 tab(s) orally once a day (at bedtime), Status: Active, Quantity: 0, Refills: None   warfarin 5 mg oral tablet: 1 tab(s) orally once a day (in the evening), Status: Active, Quantity: 0, Refills: None   benazepril 20 mg oral tablet: 1 tab(s) orally once a day, Status: Active, Quantity: 0, Refills: None   Betapace 80  mg oral tablet: 1 tab(s) orally 2 times a day, Status: Active, Quantity: 0, Refills: None   warfarin 7.5 mg oral tablet: 1 tab(s) orally once a day, Status: Active, Quantity: 0, Refills: None   losartan 100 mg oral tablet: 1 tab(s) orally once a day, Status: Active, Quantity: 0, Refills: None   B 12    2500 mg: 1  sublingual once a day, Status: Active, Quantity: 0, Refills: None    fluticasone nasal 50 mcg/inh nasal spray: 1 spray(s) nasal once a day, As Needed, Status: Active, Quantity: 0, Refills: None  Laboratory Results: Routine Chem:  24-Mar-14 04:36   B-Type Natriuretic Peptide [facility])  16536 (Result(s) reported on 22 May 2012 at 11:32AM.)  Glucose, Serum  113  BUN  32  Creatinine (comp)  1.92  Sodium, Serum 136  Potassium, Serum 4.5  Chloride, Serum 106  CO2, Serum 25  Calcium (Total), Serum  8.1  Anion Gap  5  Osmolality (calc) 280  eGFR (African American)  38  eGFR (Non-African American)  32 (eGFR values <8m/min/1.73 m2 may be an indication of chronic kidney disease (CKD). Calculated eGFR is useful in patients with stable renal function. The eGFR calculation will not be reliable in acutely ill patients when serum creatinine is changing rapidly. It is not useful in  patients on dialysis. The eGFR calculation may not be applicable to patients at the low and high extremes of body sizes, pregnant women, and vegetarians.)  Routine Coag:  24-Mar-14 04:36   Prothrombin  16.5  INR 1.3 (INR reference interval applies to patients on anticoagulant therapy. A single INR therapeutic range for coumarins is not optimal for all indications; however, the suggested range for most indications is 2.0 - 3.0. Exceptions to the INR Reference Range may include: Prosthetic heart valves, acute myocardial infarction, prevention of myocardial infarction, and combinations of aspirin and anticoagulant. The need for a higher or lower target INR must be assessed individually. Reference: The Pharmacology and Management of the Vitamin K  antagonists: the seventh ACCP Conference on Antithrombotic and Thrombolytic Therapy. CZOXWR.6045Sept:126 (3suppl): [id number] A HCT value >55% may artifactually increase the PT.  In one study,  the increase was an average of 25%. Reference:  "Effect on Routine and Special Coagulation Testing Values of Citrate Anticoagulant Adjustment in  Patients with High HCT Values." American Journal of Clinical Pathology 2006;126:400-405.)  Routine Hem:  24-Mar-14 04:36   WBC (CBC)  16.7  RBC (CBC)  2.59  Hemoglobin (CBC)  8.1  Hematocrit (CBC)  22.6  Platelet Count (CBC)  97  MCV 87  MCH 31.3  MCHC 35.9  RDW 14.5  Neutrophil % 83.6  Lymphocyte % 6.3  Monocyte % 9.9  Eosinophil % 0.1  Basophil % 0.1  Neutrophil #  14.0  Lymphocyte # 1.1  Monocyte #  1.7  Eosinophil # 0.0  Basophil # 0.0 (Result(s) reported on 22 May 2012 at 05:12AM.)   Assessment and Plan: Impression:   Anemia and thrombocytopenia. Plan:   1.  Anemia:  Patient's hemoglobin has been relatively stable over the past several days.  He did have significant bleeding, but there was concern of a possible 2nd etiology.  This is unlikely TTP given the relative be stable CBC over the past several days.  Peripheral smear was not available, but will review one tomorrow.  Will order hemolysis labs and iron studies for completeness.  Continue to monitor daily CBC.  Transfuse if hemoglobin falls below 7.0. Thrombocytopenia: Platelets have been slowly  improving over the past several days again making this unlikely TTP.  The time course is not fit for HITT.  DIC is possible, but with a normal INR unlikely.  Will order fibrinogen, d-dimer, and fibrinogen degradation products for completeness.  Thrombocytopenia possibly could be related to Bone marrow suppression from his acute illness.  There also may be a component of consumption since he was reportedly actively bleeding.  Patient does not require transfusion at this time, but would transfuse below 50,000 if the patient continues to bleed. consult, will follow.  Electronic Signatures: Delight Hoh (MD)  (Signed 24-Mar-14 17:25)  Authored: HISTORY OF PRESENT ILLNESS, PFSH, ROS, NURSING NOTES, PE, ALLERGIES, HOME MEDICATIONS, LABS, ASSESSMENT AND PLAN   Last Updated: 24-Mar-14 17:25 by Delight Hoh (MD)

## 2014-06-21 NOTE — Discharge Summary (Signed)
PATIENT NAME:  Jesus Grimes, Jesus Grimes MR#:  [id number] DATE OF BIRTH:  [birth year]  DATE OF ADMISSION:  05/19/2012 DATE OF DISCHARGE:  05/28/2012  PRIMARY CARE PHYSICIAN: Stann Mainland. Sampson Goon, MD  CONSULTING CARDIOLOGIST: Dr. Gwen Pounds.   CONSULTING NEPHROLOGISTS: Drs. Lateef and Singh.  CONSULTING ONCOLOGIST: Dr. Orlie Dakin.   CONSULTING UROLOGIST: Dr. Evelene Croon.  CONSULTING NEUROLOGIST: Dr. Mellody Drown.   DISCHARGE DIAGNOSES: 1.  Acute renal bleed, status post lithotripsy.  2.  Cerebrovascular accident, acute, embolic.  3.  Altered mental status and delirium.  4.  Atrial fibrillation.  5.  Acute renal failure.  6.  Chronic obstructive pulmonary disease exacerbation.   HISTORY OF PRESENT ILLNESS: Please see admission history and physical. Briefly, this is a  81 year old with multiple medical problems who was admitted after confusion and weakness following a lithotripsy. It was found he had a bleed in his perinephric area and was anemic. Please see admission history and physical for details.   HOSPITAL COURSE BY ISSUE:  1.  Renal bleed: The patient was seen by Dr. Evelene Croon. Coumadin and anticoagulation were held. He required 3 units of packed red blood cells and was seen by Hematology. Workup for DIC and other causes of the anemia and bleeding were negative. The patient's hemoglobin stabilized, and on the day of discharge his hemoglobin was 10.5.  2.  CVA: The patient is found to have very small embolic CVAs on MRI. He was seen by Neurology. They felt this was a relatively insignificant finding. It did not explain his altered mental status. He was continued on his statin that he was on as an outpatient. Anticoagulation was held even with the atrial fibrillation due to the bleeding.  3.  Altered mental status. This was multifactorial delirium. It improved once his medical condition improved and his neuroactive meds were held.  4.  Atrial fibrillation: He had been on sotalol, but this was discontinued due to  the renal failure. He was seen by Dr. Gwen Pounds. Dr. Lady Gary is his primary cardiologist. His rate was controlled with metoprolol. Anticoagulation was held.  5.  Hypertension: He was on an ACE inhibitor as an outpatient, but this was held. Norvasc was added and increased to 10 mg. He continued on metoprolol as well. Consider adjusting or adding back an ACE inhibitor as an outpatient.  6.  Acute renal failure, likely multifactorial: This improved so that the day of discharge, his creatinine was 1.2, which is his baseline.  7.  COPD exacerbation: The patient was a former smoker. He had progressive and impressive wheezing. He was treated with nebs and steroids, and this slowly improved. He will be discharged on a quick steroid taper.  8.  Infectious disease, likely pneumonia: The patient was treated with ceftriaxone, vancomycin and azithromycin. Cultures were negative. He did have intermittent fevers and elevated white count, but these resolved. There was a question of some component of, and evidence of, aspiration pneumonia.  9. Dysphagia: The patient was seen by Speech and Swallow. He was initially n.p.o., and then advanced diet; so at discharge, he was on a mechanical soft diet. He can probably resume outpatient regular diet if tolerated.   DISCHARGE MEDICATIONS: 1.  Levothyroxine 125 once a day.  2.  Aspirin 81 once a day.  3.  Ocuvite.  4.  Pravastatin 80 mg once a day.  5.  B12 once a day.  6.  Fluticasone once a day as needed.  7.  Tapentadol 50 mg q.4-6 hours as needed for pain.  8.  Tamsulosin 0.4 mg once a day.  9.  Prednisone 20 mg, take 2 tablets for 1 day, then take 1 tablet for 3 days, and then stop.  10.  Metoprolol 50 mg twice a day.  11.  Amlodipine 10 mg once a day.   The patient will stop taking his Coumadin, lisinopril and sotalol.   Discharged to home with home health for physical therapy and nursing for balance, falls risk, and complicated medical conditions.   DISCHARGE DIET:  Low-sodium, carbohydrate-controlled, with mechanical soft diet.   DISCHARGE ACTIVITY: As tolerated.   DISCHARGE FOLLOWUP: The patient will follow up with Drs. Eulis FosterFitzgerald, Vath, and BethuneWolff within 1 to 2 weeks of discharge.   This discharge took 45 minutes.     ____________________________ Stann Mainlandavid P. Sampson GoonFitzgerald, MD dpf:dm D: 05/28/2012 14:21:00 ET T: 05/28/2012 15:25:36 ET JOB#: [id number]  cc: Stann Mainlandavid P. Sampson GoonFitzgerald, MD, <Dictator> Trayon Sampson GoonFITZGERALD MD ELECTRONICALLY SIGNED 05/31/2012 15:43

## 2014-06-21 NOTE — Discharge Summary (Signed)
Dates of Admission and Diagnosis:  Date of Admission 15-Aug-2012   Date of Discharge 16-Aug-2012   Admitting Diagnosis afib   Final Diagnosis afib   Discharge Diagnosis 1 afib   2 coronary artery disease   3 hypertension   4 hyperlipidemia    Chief Complaint/History of Present Illness weakness and fatigue with shortness of breath   Hospital Course:  Hospital Course presented for load with sotolol. Tolerated well . Remained in afib after load. Underwent cardioversion to nsr successfully this am no complications. Remains in nsr.   Condition on Discharge Satisfactory   DISCHARGE INSTRUCTIONS HOME MEDS:  Medication Reconciliation: Patient's Home Medications at Discharge:     Medication Instructions  levothyroxine 125 mcg (0.125 mg) oral tablet  1 tab(s) orally once a day   aspir 81 oral delayed release tablet  1 tab(s) orally once a day   ocuvite oral tablet  1 tab(s) orally once a day   pravastatin 80 mg oral tablet  1 tab(s) orally once a day (at bedtime)   b 12    2500 mg  1  sublingual once a day   fluticasone nasal 50 mcg/inh nasal spray  1 spray(s) nasal once a day, As Needed   furosemide  120 milligram(s) orally once a day   warfarin 5 mg oral tablet  1 tab(s) orally q sat ,sun, tue, ;and wed nights   warfarin 7.5 mg oral tablet  7.5 milligram(s) orally  monday thur and friday nights   duoneb 2.5 mg-0.5 mg/3 ml inhalation solution   inhaled 2 times a day, As Needed - for Shortness of Breath   benazepril 20 mg oral tablet  1 tab(s) orally once a day   sotalol  80 milligram(s) orally 2 times a day   potassium chloride 10 meq oral tablet, extended release  1 tab(s) orally once a day    STOP TAKING THE FOLLOWING MEDICATION(S):   metoprolol  Physician's Instructions:  Home Health? No   Treatments None   Home Oxygen? No   Diet Low Fat, Low Cholesterol   Dietary Supplements None   Diet Consistency Regular Consistency   Activity Limitations As tolerated    Return to Work Not Applicable   Time frame for Follow Up Appointment 1-2 weeks  Asar Evilsizer   Electronic Signatures: Dalia HeadingFath, Celenia Hruska A (MD)  (Signed 18-Jun-14 12:46)  Authored: ADMISSION DATE AND DIAGNOSIS, CHIEF COMPLAINT/HPI, HOSPITAL COURSE, DISCHARGE INSTRUCTIONS HOME MEDS, PATIENT INSTRUCTIONS   Last Updated: 18-Jun-14 12:46 by Dalia HeadingFath, Kaynen Minner A (MD)

## 2014-06-21 NOTE — Consult Note (Signed)
Chief Complaint:  Subjective/Chief Complaint Arousable but disoriented. Not complaining of flank pain   VITAL SIGNS/ANCILLARY NOTES: **Vital Signs.:   24-Mar-14 05:03  Vital Signs Type Routine  Temperature Temperature (F) 98.7  Celsius 37  Temperature Source oral  Pulse Pulse 118  Respirations Respirations 36  Systolic BP Systolic BP 134  Diastolic BP (mmHg) Diastolic BP (mmHg) 69  Mean BP 90  Pulse Ox % Pulse Ox % 92  Pulse Ox Activity Level  At rest  Oxygen Delivery Room Air/ 21 %    06:20  Vital Signs Type Recheck  Respirations Respirations 26    06:21  Vital Signs Type Recheck  Respirations Respirations 26    08:58  Vital Signs Type Q 4hr  Temperature Temperature (F) 99.6  Celsius 37.5  Temperature Source oral  Pulse Pulse 142  Respirations Respirations 34  Systolic BP Systolic BP 203  Diastolic BP (mmHg) Diastolic BP (mmHg) 99  Mean BP 133  Pulse Ox % Pulse Ox % 87  Oxygen Delivery Room Air/ 21 %  Pulse Ox Heart Rate 108    09:20  Systolic BP Systolic BP 183  Diastolic BP (mmHg) Diastolic BP (mmHg) 85  Mean BP 117    09:21  Vital Signs Type Recheck  Temperature Temperature (F) 99.6  Celsius 37.5  Temperature Source oral  Pulse Pulse 149  Systolic BP Systolic BP 183  Diastolic BP (mmHg) Diastolic BP (mmHg) 85  Mean BP 117  Pulse Ox % Pulse Ox % 96  Oxygen Delivery Room Air/ 21 %  Pulse Ox Heart Rate 108   Brief Assessment:  Gastrointestinal No CVAT. Abd. soft and non-tender   Lab Results: Routine Chem:  21-Mar-14 00:48   BUN  26  Creatinine (comp)  2.63  22-Mar-14 03:57   BUN  39  Creatinine (comp)  3.30  23-Mar-14 04:44   BUN  39  Creatinine (comp)  2.45  24-Mar-14 04:36   BUN  32  Creatinine (comp)  1.92  Routine Hem:  21-Mar-14 00:48   WBC (CBC)  13.6  RBC (CBC)  3.61  Hemoglobin (CBC)  10.7  Hematocrit (CBC)  31.6  Platelet Count (CBC) 152  22-Mar-14 03:57   WBC (CBC)  14.0  RBC (CBC)  2.72  Hemoglobin (CBC)  7.9  Hematocrit  (CBC)  23.6  Platelet Count (CBC)  87    14:54   Hemoglobin (CBC)  6.8 (Result(s) reported on 20 May 2012 at 03:29PM.)  Hematocrit (CBC)  20.5 (Result(s) reported on 20 May 2012 at 03:29PM.)  23-Mar-14 04:44   WBC (CBC)  15.3  RBC (CBC)  2.65  Hemoglobin (CBC)  7.9  Hematocrit (CBC)  22.9  Platelet Count (CBC)  86    15:54   Hemoglobin (CBC)  7.6 (Result(s) reported on 21 May 2012 at 04:15PM.)  24-Mar-14 04:36   WBC (CBC)  16.7  RBC (CBC)  2.59  Hemoglobin (CBC)  8.1  Hematocrit (CBC)  22.6  Platelet Count (CBC)  97   Radiology Results: US:    21-Mar-14 18:31, US Kidney Bilateral  US Kidney Bilateral   REASON FOR EXAM:    acute renal failure, urtereolithiasis  COMMENTS:       PROCEDURE: US  - US KIDNEY  - May 19 2012  6:31PM     RESULT: History: Renal failure.    Comparison Study: KUB 05/18/2012.    Findings: This was a difficult exam due to body habitus. Right kidney   measures 11.6 cm, the left  13.7 cm. No hydronephrosis. Normal renal blood   flow on right. Prominent column of Bertin a noted. Small right renal   calyceal stones cannot be excluded.     On the left, the kidney is extremely swollen and lucent. Cortical     medullary junction is not delineated. No definite blood flow noted.     Urine is present in the bladder, bladder nondistended.    IMPRESSION:  Extremely swollen left kidney with no definite blood flow   noted. Acute ischemia from arterial or venous abnormality or acute   process such as nephritis or pyelonephritis could present in this fashion.        Verified By: Gwynn Burly, M.D., MD  CT:    22-Mar-14 11:22, CT Abdomen and Pelvis Without Contrast  CT Abdomen and Pelvis Without Contrast   REASON FOR EXAM:    (1) diminished renal blood flow in left kidney post   ESWL evaluate for subcapsula  COMMENTS:       PROCEDURE: CT  - CT ABDOMEN AND PELVIS W0  - May 20 2012 11:22AM     RESULT: History: Decreased flow left kidney. Prior  ESWL.    Comparison Study: Ultrasound kidneys 05/19/2012.    Findings: Standard nonenhanced CT obtained. Left pleural effusion.   Atelectasis lung bases. Small right pleural effusion. Liver normal.   Gallbladder nondistended. Pancreas is normal. There is a largeleft   perirenal hematoma measuring 13 x 12 x 11 cm. The hematoma displaces the   kidney medially and anteriorly. Left perirenal fluid is present. Left     urolithiasis appears present. Stone debris noted in bladder. Bladder   nondistended. Calcification prostate. Phleboliths. No bowel distention or   free air. Aorta normal in caliber. Report phoned to patient's physician.    IMPRESSION:  Extremely large left perirenal hematoma. Left urolithiasis.   Stone debris in bladder.        Verified By: Gwynn Burly, M.D., MD   Assessment/Plan:  Assessment/Plan:  Assessment Left Renal hematoma s/p ureteral ESL   Plan Suggest transfuse 2 more units. Continue to monitor Hgb/hct. Avoid anti-coagulation until platelet count normalizes and Hgb/hct stabilizes.   Electronic Signatures: Orson Ape (MD)  (Signed 24-Mar-14 10:00)  Authored: Chief Complaint, VITAL SIGNS/ANCILLARY NOTES, Brief Assessment, Lab Results, Radiology Results, Assessment/Plan   Last Updated: 24-Mar-14 10:00 by Orson Ape (MD)

## 2014-06-21 NOTE — Consult Note (Signed)
Chief Complaint:  Subjective/Chief Complaint More responsive. No flank pain   VITAL SIGNS/ANCILLARY NOTES: **Vital Signs.:   25-Mar-14 00:11  Vital Signs Type 15 min Post Blood Start Time  Temperature Temperature (F) 98.5  Celsius 36.9  Temperature Source oral  Pulse Pulse 90  Respirations Respirations 24  Systolic BP Systolic BP 112  Diastolic BP (mmHg) Diastolic BP (mmHg) 72  Mean BP 85  Pulse Ox % Pulse Ox % 97  Oxygen Delivery 2L    03:11  Vital Signs Type Q 4hr  Temperature Temperature (F) 98.5  Celsius 36.9  Temperature Source oral  Pulse Pulse 91  Pulse source if not from Vital Sign Device per Telemetry Clerk  Respirations Respirations 28  Systolic BP Systolic BP 133  Diastolic BP (mmHg) Diastolic BP (mmHg) 81  Mean BP 98  Pulse Ox % Pulse Ox % 97  Pulse Ox Activity Level  At rest  Oxygen Delivery 3L  Pulse Ox Heart Rate 108  Telemetry pattern Cardiac Rhythm Converts back and forth between SR and Afib    03:53  Vital Signs Type Blood Transfusion Complete  Temperature Temperature (F) 98.8  Celsius 37.1  Temperature Source oral  Pulse Pulse 110  Respirations Respirations 24  Systolic BP Systolic BP 147  Diastolic BP (mmHg) Diastolic BP (mmHg) 84  Mean BP 105  Pulse Ox % Pulse Ox % 99  Oxygen Delivery 3L    05:12  Vital Signs Type Routine  Temperature Temperature (F) 98.4  Celsius 36.8  Temperature Source oral  Pulse Pulse 109  Respirations Respirations 26  Systolic BP Systolic BP 131  Diastolic BP (mmHg) Diastolic BP (mmHg) 82  Mean BP 98  Pulse Ox % Pulse Ox % 98  Pulse Ox Activity Level  At rest  Oxygen Delivery 3L    09:01  Vital Signs Type Q 4hr  Temperature Temperature (F) 98.5  Celsius 36.9  Temperature Source oral  Pulse Pulse 108  Respirations Respirations 22  Systolic BP Systolic BP 140  Diastolic BP (mmHg) Diastolic BP (mmHg) 85  Mean BP 103  Pulse Ox % Pulse Ox % 95  Pulse Ox Activity Level  At rest  Oxygen Delivery 3L; Nasal  Cannula    13:40  Temperature Temperature (F) 96.6  Celsius 35.8  Pulse Pulse 118  Respirations Respirations 24  Systolic BP Systolic BP 190  Diastolic BP (mmHg) Diastolic BP (mmHg) 93  Mean BP 125  Pulse Ox % Pulse Ox % 97  Pulse Ox Activity Level  At rest  Oxygen Delivery 3L    13:53  Vital Signs Type Routine  Temperature Temperature (F) 96.6  Celsius 35.8  Temperature Source AdultAxillary  Pulse Pulse 114  Respirations Respirations 24  Systolic BP Systolic BP 190  Diastolic BP (mmHg) Diastolic BP (mmHg) 93  Mean BP 125  Pulse Ox % Pulse Ox % 99  Pulse Ox Activity Level  At rest  Oxygen Delivery Nebulizer Treatment/ SVN    13:54  Vital Signs Type Recheck; vital machine  Systolic BP Systolic BP 156  Diastolic BP (mmHg) Diastolic BP (mmHg) 98  Mean BP 117  BP Source  if not from Vital Sign Device dyno map  Pulse Ox % Pulse Ox % 97  Pulse Ox Activity Level  At rest  Oxygen Delivery 3L    13:56  Vital Signs Type Recheck; manual  Systolic BP Systolic BP 160  Diastolic BP (mmHg) Diastolic BP (mmHg) 90  Mean BP 113  BP Source  if  not from Vital Sign Device manual    14:43  Vital Signs Type Recheck; manual  Systolic BP Systolic BP 112  Diastolic BP (mmHg) Diastolic BP (mmHg) 74  Mean BP 86  BP Source  if not from Vital Sign Device manual   Brief Assessment:  Additional Physical Exam Some ecchymosis of scrotum and foreskin   Lab Results: Routine Chem:  21-Mar-14 00:48   Creatinine (comp)  2.63  22-Mar-14 03:57   Creatinine (comp)  3.30  23-Mar-14 04:44   Creatinine (comp)  2.45  24-Mar-14 04:36   Creatinine (comp)  1.92  25-Mar-14 04:36   Creatinine (comp)  1.95  Routine Hem:  21-Mar-14 00:48   Hematocrit (CBC)  31.6  22-Mar-14 03:57   Hematocrit (CBC)  23.6    14:54   Hematocrit (CBC)  20.5 (Result(s) reported on 20 May 2012 at 03:29PM.)  23-Mar-14 04:44   Hematocrit (CBC)  22.9  24-Mar-14 04:36   Hematocrit (CBC)  22.6    17:15   Hematocrit (CBC)   21.9  25-Mar-14 04:36   Hematocrit (CBC)  25.6   Assessment/Plan:  Assessment/Plan:  Assessment Overall condition improving. Hct/hgb stable.   Plan Continue to monitor hgb/hct. Avoid anti-coagulants   Electronic Signatures: Orson ApeWolff, Loui Massenburg R (MD)  (Signed 25-Mar-14 16:48)  Authored: Chief Complaint, VITAL SIGNS/ANCILLARY NOTES, Brief Assessment, Lab Results, Assessment/Plan   Last Updated: 25-Mar-14 16:48 by Orson ApeWolff, Dhruti Ghuman R (MD)

## 2014-06-21 NOTE — Consult Note (Signed)
PATIENT NAME:  Jesus Grimes, Jesus Grimes MR#:  [id number] DATE OF BIRTH:  February 15, 1932  DATE OF CONSULTATION:  05/19/2012  REFERRING PHYSICIAN:   Dr. Nicholes Mango CONSULTING PHYSICIAN:  Avari Gelles Lilian Kapur, MD  REASON FOR CONSULTATION: Acute renal failure in the setting of recent lithotripsy and left-sided nephrolithiasis.   HISTORY OF PRESENT ILLNESS: The patient is a very pleasant 81 year old Caucasian male with past medical history of hypertension, hyperlipidemia, COPD, chronic kidney disease stage III, hypothyroidism, coronary artery disease status post 4-vessel CABG, recent lithotripsy with ureterolithiasis, history of melanoma status post resection who presented to [facility] yesterday originally for ureterolithiasis. The patient underwent lithotripsy. It is reported to [place] that the patient had elevated blood pressure during the procedure and was administered IV antihypertensives. Subsequent to the procedure, the patient had blood pressure drop at home and systolic blood pressure in the 70s. The patient developed dysarthria, weakness and confusion. He subsequently came back to [facility] for evaluation. He had an MRI of the brain performed which showed multiple punctate bilateral cerebral infarcts. The patient was subsequently admitted. We are consulted for the evaluation and management of acute renal failure. On 11/23/2011, the patient's creatinine was 1.24 with an eGFR of 55. Yesterday creatinine was found to be 2.63. The patient's wife reports to me that he was told he had acute renal failure likely due to ureterolithiasis. The patient was on losartan, as well as benazepril at home.   PAST MEDICAL HISTORY: 1.  Hypertension.  2.  Hyperlipidemia.  3.  Coronary artery disease status post 4-vessel CABG at [facility].  4.  COPD. 5.  Chronic kidney disease stage III, baseline eGFR 55.  6.  Hypothyroidism.  7.  Chronic atrial fibrillation with history of multiple  cardioversions.  8.  Ureterolithiasis on the left status post lithotripsy.  9.  Melanoma resection.  ALLERGIES:  AMBIEN AND MORPHINE.  1.  0.9 normal saline at 100 mL/h.  2.  Tylenol 650 mg p.o. q. 6 hours p.r.n.  3.  Aspirin 325 mg daily.  4.  Azithromycin 500 mg IV q.  24 hours.  5.  Ceftriaxone 1 gram IV q. 24 hours.  6.  Lovenox 110 mg subcutaneous q. 12 hours.  7.  Fluticasone 2 sprays both nostrils daily.  8.  Synthroid 0.125 mg p.o. q. 6:00 a.m.  9.  Zofran 4 mg IV q. 6 hours p.r.n.  10.  Pravastatin 80 mg p.o. at bedtime.  11.  Ranitidine 150 mg p.o. q. 12 hours.  12.  Flomax 0.4 mg p.o. daily.  13.  Nucynta 50 mg p.o. q. 4 hours p.r.n. pain.   SOCIAL HISTORY: The patient is originally from [place]. He now lives in [place] with his wife. He has 4 daughters. 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 He denies tobacco use. He drinks alcohol occasionally. Denies illicit drug use.   FAMILY HISTORY: Mother died secondary to breast cancer. Father died in a motor vehicle accident. No family history of end-stage renal disease.  REVIEW OF SYSTEMS:  CONSTITUTIONAL: The patient denies fevers, chills, weight loss.  EYES: Denies diplopia, blurry vision.  HEENT:  Denies hearing loss, tinnitus, epistaxis, sore throat.  CARDIOVASCULAR: Denies chest pains or palpitations. Does have history of underlying coronary disease status post CABG and atrial fibrillation.  RESPIRATORY: Denies cough, shortness of breath, hemoptysis.  GASTROINTESTINAL: Has had some nausea, but denies vomiting or diarrhea.  GENITOURINARY:  Denies frequency. Does  report diminished urine output, however. MUSCULOSKELETAL: Denies joint pain, swelling or redness.  INTEGUMENTARY: Denies skin rashes or lesions.  NEUROLOGIC: Did have slurring of speech dysarthria, lower extremity weakness and confusion. Found to have him multiple punctate infarcts on MRI.  PSYCHIATRIC: Denies  depression, bipolar disorder.  ENDOCRINE: Denies polyuria, polydipsia or polyphagia. HEMATOLOGIC AND LYMPHATIC: Denies easy bruisability, bleeding or swollen lymph nodes. ALLERGY AND IMMUNOLOGIC: Denies seasonal allergies or history of immunodeficiency.   PHYSICAL EXAMINATION: VITAL SIGNS: Temperature 98, pulse 68, respirations 18, blood pressure 119/75, pulse ox 95%.  GENERAL: Reveals well-developed, well-nourished Caucasian male who appears his stated age, currently in no acute distress.  HEENT: Normocephalic, atraumatic. Extraocular movements are intact. Pupils are equal, round and reactive to light. No scleral icterus. Conjunctivae are pink. No epistaxis noted. Gross hearing intact. Oral mucosa are moist.  NECK: Supple without JVD or lymphadenopathy.  LUNGS: Clear to auscultation bilaterally with normal respiratory effort.  CARDIOVASCULAR:  S1, S2. The patient is currently regular rate and rhythm. No murmurs or rubs appreciated.  ABDOMEN: Obese, soft, nontender. Mild distention noted. No rebound or guarding. No gross organomegaly appreciated.  EXTREMITIES: No clubbing or cyanosis. Trace bilateral lower extremity edema noted.  NEUROLOGIC: The patient is currently awake, alert and oriented to person and place. Strength is 5 out of 5 in both upper and lower extremities. Gross sensation appears to be intact. The patient is able to close both eyes symmetrically. No tongue deviation was noted. Extraocular movements were intact. The patient was able to shrug both shoulders. No facial droop noted at this time.   LABORATORY DATA: Sodium 135, potassium 4.9, chloride 100, CO2 26, BUN 26, creatinine 2.63, glucose 189. LFTs show total protein 6.2, albumin 3.2, total bilirubin 1.1, alkaline phosphatase 69, AST 21, ALT 16. CBC shows WBC 13.6, hemoglobin 10.7, hematocrit 31, platelets 152, INR 1.2. Urinalysis shows urine protein 100 mg/dL, 395 RBCs per high-power field,  5 WBCs per high-power field, trace  bacteria noted. ABG shows pH of 7.4, pCO2 41, pO2 63, FiO2 21%.  2D echocardiogram shows ejection fraction of 55% to 60%.  MRI brain shows multiple punctate bilateral cerebral infarcts.  Carotid duplex was negative for hemodynamically significant carotid artery stenosis.   IMPRESSION:  This is a 81 year old Caucasian male with past medical history of hypertension, hyperlipidemia, chronic obstructive pulmonary disease, chronic kidney disease, stage III, baseline eGFR 55, hypothyroidism, chronic atrial fibrillation on Coumadin, coronary artery disease status post 4-vessel coronary artery bypass graft, recent left-sided ureterolithiasis status post extracorporeal shockwave lithotripsy, history of melanoma status post resection who presented to [facility] shortly after his lithotripsy and was found to have slurring of speech, lower extremity weakness and confusion. Multiple punctate infarcts noted on MRI brain.   PROBLEM LIST: 1.  Acute renal failure.  2.  Left-sided ureterolithiasis status post extracorporal shockwave lithotripsy.  3.  Multiple acute cerebrovascular accidents.  4.  Chronic kidney disease, stage III.   PLAN:  The patient had extracorporal shockwave lithotripsy yesterday. As part of this procedure, his wife relates to me that he was given IV antihypertensives. Subsequent to the procedure, the patient was found to have low blood pressure and developed dysarthria, lower extremity weakness and confusion. The patient was found to have multiple infarcts in both cerebral hemispheres. He may have had a watershed-type of event from the hypotension. However, at present it appears that he is recovering, as there was no significant dysarthria and strength was 5 out of 5 in both upper  and lower extremities. In regards to his acute renal failure, obstruction may be playing a role. We will obtain renal ultrasound for further evaluation. Dr. Yves Dill is also following on the case at  present.  We plan to also check SPEP, UPEP, intact PTH, phosphorus and vitamin D 25 level given the fact that he has underlying chronic kidney disease. Certainly no indication for dialysis at this point in time. Continue IV fluid hydration as you are doing. Avoid nephrotoxins and contrast exposure at this time. Further management as patient progresses.  I would like to thank Dr. Margaretmary Eddy for this kind referral.    ____________________________ Tama High, MD mnl:ce D: 05/19/2012 17:08:33 ET T: 05/19/2012 17:59:33 ET JOB#: [id number]  cc: Tama High, MD, <Dictator> Mariah Milling Nozomi Mettler MD ELECTRONICALLY SIGNED 06/02/2012 11:34

## 2014-06-21 NOTE — Consult Note (Signed)
PATIENT NAME:  Jesus, Grimes MR#:  [id number] DATE OF BIRTH:  Sep 22, 1931  DATE OF CONSULTATION:  05/22/2012  CONSULTING PHYSICIAN:  Jesus Blinks, MD  PRIMARY CARE PHYSICIAN: Dr. Sampson Goon.   REASON FOR CONSULTATION: Atrial fibrillation with rapid ventricular rate, hypertension, hyperlipidemia, stroke, anemia, diastolic dysfunction having acute onset of A-fib with rapid rate.   CHIEF COMPLAINT: The patient was not responsive.  HISTORY OF PRESENT ILLNESS: This is an elderly male with known diastolic dysfunction as well as mitral valve insufficiency, hypertension, hyperlipidemia and recurrent episodes of atrial fibrillation. He had previously been on anticoagulation but has had significant anemia and bleeding complications after a lithotripsy episode, for which his hemoglobin is 8.1. He also has had worsening chronic kidney disease and acute kidney issues after lithotripsy with a creatinine of 3.8, now down to 1.9. He has had previous atrial fibrillation with treatment with sotalol, maintaining normal rhythm until this morning when he had atrial fibrillation with rapid ventricular rate of 130 beats/minute and had some mild amount of hypertension. The patient had previously been on sotalol, but this was discontinued due to his acute and renal insufficiency. The patient has had metoprolol use as well with reasonable heart rate control at 120 beats/minute at this time. Telemetry currently shows atrial fibrillation with nonspecific ST changes. The patient does have coronary artery disease, for which he has previously had bypass surgery, although no evidence of acute myocardial infarction. There is pulmonary edema, possibly consistent with multiple factors. He also has had some neurologic issues at this time with encephalopathy and known previous stroke, multifactorial in nature, including embolic and a thrombotic stroke in the past. Currently, the patient is relatively stable.   REVIEW OF SYSTEMS: Cannot  be assessed due to significant neurologic abnormalities.   PAST MEDICAL HISTORY:  1.  Atrial fibrillation.  2.  Coronary artery disease.  3.  Hypertension.  4.  Hyperlipidemia.  5.  Kidney disease.  6.  Anemia.  7.  Diastolic dysfunction with valvular heart disease.   FAMILY HISTORY: No family members with early onset of cardiovascular disease.   SOCIAL HISTORY: He has a remote tobacco use and occasionally drinking wine.   ALLERGIES: As listed.   MEDICATIONS: As listed.   PHYSICAL EXAMINATION:  VITAL SIGNS: Blood pressure 126/68 bilaterally, heart rate is 110 upright, reclining and irregular.  GENERAL: He is a disheveled, elderly male in some mild respiratory distress.  HEAD, EYES, EARS, NOSE, THROAT: No icterus, thyromegaly, ulcers, hemorrhage or xanthelasma.  CARDIOVASCULAR: Irregularly irregular. Normal S1 and S2 with diffuse heart sounds. Cannot assess for mitral and tricuspid regurgitation. PMI is diffuse. Carotid upstroke normal without bruit. Jugular venous pressure is normal.  LUNGS: Lungs have bibasilar crackles and expiratory wheezes.  ABDOMEN: Soft, nontender. Cannot assess hepatosplenomegaly or masses due to increased abdominal girth.  EXTREMITIES: 2+ radial femoral, trace to 1+ lower extremity edema. No cyanosis or clubbing at this time.   NEUROLOGIC: He is obtunded.   ASSESSMENT: Elderly male with hypertension, hyperlipidemia, stroke, transient ischemic attack, acute renal insufficiency, diastolic dysfunction, mitral valve insufficiency, atrial fibrillation with rapid ventricular rate, congestive heart failure,  acute diastolic in nature.   ANY FURTHER TREATMENT OPTIONS AND RECOMMENDATIONS:  1.  Increase metoprolol for better heart rate control while the patient has atrial fibrillation with rapid ventricular rate.  2.  Continue treatment of renal insufficiency and acute kidney disease and would consider Lasix, 1 dose for pulmonary edema watching for worsening issues.   3.  Discontinuation of intravenous fluids,  which may cause worsening pulmonary edema.  4.  No treatment with anticoagulation due to anemia and recent bleed.  5.  Abstain from sotalol due to concerns of renal failure and other concerns of issues with metabolism and would consider further intervention including amiodarone at a later date if able.  6.  Hypertension control if necessary with previous medications including metoprolol.  7.  Consider echocardiogram for further evaluation of valvular heart disease and diastolic dysfunction consistent with current issues.  8.  Oxygenation while patient has issues listed above.  9.  Further treatment options after above.      ____________________________ Jesus BlinksBruce J. Nathon Stefanski, MD bjk:lo D: 05/22/2012 13:42:50 ET T: 05/22/2012 14:01:17 ET JOB#: [id number]  cc: Jesus BlinksBruce J. Jesus Fentress, MD, <Dictator> Jesus BlinksBRUCE J Padraic Marinos MD ELECTRONICALLY SIGNED 05/24/2012 13:43

## 2014-06-21 NOTE — Consult Note (Signed)
DATE OF BIRTH:  10-25-1931  DATE OF CONSULTATION:  05/20/2012  REQUESTING PHYSICIAN:  Dr. Dan Humphreys  CONSULTING PHYSICIAN:  Elana Alm. Olin Pia, MD  REASON FOR CONSULTATION:  Stroke.   HISTORY OF PRESENT ILLNESS:  The patient is a 81 year old white male with a known prior history of chronic atrial fibrillation, usually maintained on Coumadin, as well as hypertension, dyslipidemia and hypothyroidism. He is undergoing evaluation for stroke. He underwent a lithotripsy about 2 days ago. His wife indicates that once they arrived home, he appeared to be quite confused at a bit "disconnected". He had trouble speaking, and would say very unusual items, and had trouble getting out his words. Furthermore, he even had trouble ambulating due to significant weakness. She brought him back to the hospital, and he was noted to have a blood pressure of 78/46. In the Emergency Room, he also had a head CT that revealed only chronic changes, a white count that was elevated, and elevated creatinine of 2.2. He received some IV fluids for his low blood pressure, along with IV Rocephin for possible pneumonia. Shortly thereafter, his speech and confusion appeared to be much better. It was felt that his atrial fibrillation was rate controlled. His INR was subtherapeutic at 1.2, as his Coumadin was recently held for his lithotripsy. Upon admission to the hospital, he then had a head MRI performed on 05/19/2012, which revealed multiple punctate bilateral cerebral infarctions, along with sphenoid sinusitis and severe white matter changes. Due to the acute infarctions, a  neurologist is consulted now for further evaluation.   Since admission, the patient's mental status has improved, although the nurse indicates that today he was very confused at lunchtime, but this apparently was quite transient. At home, he apparently is a very functional individual who is independent in his ADLs. However, he cannot drive due to difficulties with  macular degeneration.   PAST MEDICAL HISTORY:  Notable for chronic atrial fibrillation, with usual maintenance on Coumadin, as noted above, hypertension, dyslipidemia, COPD, chronic kidney disease and hypothyroidism.   PAST SURGICAL HISTORY: Includes coronary artery bypass grafting, lithotripsy, cardioversion for atrial fibrillation, skin cancer resection.  MEDICATIONS AT THIS TIME INCLUDE:  Albuterol, sodium  chloride, Ecotrin, Rocephin, azithromycin, ranitidine, ondansetron, Nucynta, Betapace, Flomax, Pravachol, Synthroid.  ALLERGIES:  AMBIEN, MORPHINE.  SOCIAL HISTORY:  He resides in the [place], [place]. He lives with his wife.  He does not smoke, but he does drink wine on a daily basis.   FAMILY HISTORY:  Both parents deceased at young age.    REVIEW OF SYSTEMS:  Neurological ROS ast noted above    PHYSICAL EXAMINATION:  GENERAL:  Reveals a pleasant, cooperative, elderly white male who is in no acute distress.  VITAL SIGNS:  He is currently afebrile. Vital signs are stable.  NEUROLOGIC:  He is alert. He is confused. He does not know the year, but he does know he is in a hospital. However, not the name of the hospital. He is able to name, repeat and follow commands. Prior intellectual function appears to be intact. His extraocular movements were intact. Pupils equal, round, reactive to light. Face is symmetric. Palate elevates symmetrically. Tongue protrudes midline. Otherwise cranial nerves II through XII appear to be intact. Motor examination:  He appears to have normal bulk and tone, with 5/5 strength throughout. Sensory exam reveals symmetric pinprick and vibration. Cerebellar exam reveals normal  finger-to-nose. No evidence of pronator drift. Deep tendon reflexes are grade 1+, with toes downgoing.  CARDIOVASCULAR EXAM:  Regular  rate and rhythm, without murmur, rub or gallop. No evidence of carotid bruits. No evidence of cyanosis, clubbing or edema.  ABDOMEN:  Soft, nontender,  with normoactive bowel sounds.  LUNGS:  Clear to auscultation. SKIN EXAM:  There are no obvious cuts, abrasions, bruises or rashes.   DIAGNOSTIC STUDIES INCLUDE:  A head MRI revealing bilateral acute cerebral infarctions, as noted above. Head CT performed yesterday revealed no acute changes. However, there was previous lacunar infarction seen in the left basal ganglia. INR today is 1.4. His metabolic panel reveals elevated BUN and creatinine of 39 and 3.3. He has a hemoglobin and hematocrit that are low at 7.9 and 23.6, with a platelet count of only 87,000.   ASSESSMENT: 1.  Bilateral acute cerebral infarctions, likely embolic.  2.  Chronic atrial fibrillation, with subtherapeutic INR, likely associated with #1. 3.  Fluctuating altered mental status, rule out partial seizures due to above.   DISCUSSION:  At this time, the patient presents with a history of chronic atrial fibrillation on Coumadin. However, this was held due to recent lithotripsy. After the procedure, he has been noted to be confused and weak, with evidence of a perirenal hematoma as well as acute blood loss. His mental status has improved. However, he still has some fluctuating mental status, according to the  nurse's reports. Neurological examination at this time does reveal some confusion, but otherwise is nonfocal. Overall, I suspect he likely has embolized as a cause for his acute bilateral cerebral infarctions, most likely due to the fact his INR is subtherapeutic off of Coumadin. The etiology of his fluctuating mental status is unclear, but the possibility of partial seizures will need to be considered.   PLAN:  At this time, I would suggest anticoagulation, once you are able from a nephrology and medical standpoint. Certainly, at this point, this is probably not an option with his perirenal hematoma. Once he is anticoagulated, an agent such as Eliquis may be a better option than Coumadin, given lower risk of bleeding.   An EEG  will be ordered, given his fluctuating mental status. I am going to cover him with Keppra in the interim timeframe, given the outside chance his intermittent confusion could be seizure-related. I have discussed the case with the patient's family at the bedside. Thank you very much for allowing me to participate in the care of this very interesting and pleasant patient.      ____________________________ Elana Almobert A. Olin PiaYapundich, MD ray:mr D: 05/20/2012 17:10:02 ET T: 05/20/2012 17:44:36 ET JOB#: [id number]  cc: Molly Maduroobert A. Olin PiaYapundich, MD, <Dictator> Theodosia QuayOBERT A Geneive Sandstrom MD ELECTRONICALLY SIGNED 06/14/2012 22:55

## 2014-06-22 NOTE — Consult Note (Signed)
PATIENT NAME:  Jesus Grimes, Jesus Grimes MR#:  [id number] DATE OF BIRTH:  1931-07-26  DATE OF CONSULTATION:  03/19/2013  CONSULTING PHYSICIAN:  Marcina MillardAlexander Zyia Kaneko, MD  PRIMARY CARE PHYSICIAN: Stann Mainlandavid P. Sampson GoonFitzgerald, MD  CARDIOLOGIST: Darlin PriestlyKenneth A. Lady GaryFath, MD  CHIEF COMPLAINT: Shortness of breath.   REASON FOR CONSULTATION: Consultation requested for evaluation of congestive heart failure.   HISTORY OF PRESENT ILLNESS: The patient is a 81 year old gentleman with known history of coronary artery disease, status post prior bypass graft surgery, chronic atrial fibrillation, and COPD. The patient apparently was in his usual state of health until 2 to 3 weeks ago, when he has had recurrent episodes of shortness of breath, cough, and wheezing. The patient has been treated with outpatient antibiotics and prednisone. The patient's wife noted that within the past week while on prednisone, the patient experienced significant fluid retention. [facility] Room with congestive heart failure and pulmonary edema. The patient's wife noted that he had a similar experience just after bypass graft surgery.   PAST MEDICAL HISTORY: 1.  Status post coronary artery bypass graft surgery 3-1/2 years ago.  2.  Chronic atrial fibrillation.  3.  Hypertension.  4.  Hyperlipidemia.  5.  COPD.  6.  Chronic kidney disease.  7.  Hypothyroidism.   MEDICATIONS: Sotalol 80 mg b.i.d., pravastatin 80 mg at bedtime, losartan 100 mg daily, furosemide 40 mg daily, benazepril 20 mg daily, aspirin 81 mg daily, amlodipine 5 mg daily, vitamin B12 at 2500 mcg sublingual daily, Ventolin HFA inhaler 2 puffs q.i.d. p.r.n., Flomax 0.4 mg daily, potassium chloride 10 mEq daily, Ocuvite 1 daily, levothyroxine 125 mcg daily, fluticasone nasal spray 50 mcg daily, finasteride 5 mg at bedtime.   SOCIAL HISTORY: The patient is married and resides with his wife. He has a remote tobacco abuse history.   FAMILY HISTORY: No immediate family history  of coronary artery disease or myocardial infarction.   REVIEW OF SYSTEMS:    CONSTITUTIONAL: No fever or chills.  EYES: No blurry vision.  EARS: No hearing loss.  RESPIRATORY: Shortness of breath, cough, and wheezing as described above.  CARDIOVASCULAR: The patient denies chest pain. He does have pedal edema.  GASTROINTESTINAL: No nausea, vomiting, diarrhea, or constipation.  GENITOURINARY: No dysuria or hematuria.  ENDOCRINE: No polyuria or polydipsia.  MUSCULOSKELETAL: No arthralgias or myalgias.  NEUROLOGICAL: No focal muscle weakness or numbness.  PSYCHOLOGICAL: No depression or anxiety.   PHYSICAL EXAMINATION: VITAL SIGNS: Blood pressure 151/77, pulse 69, respirations 18, temperature 98.6, pulse oximetry 97%.  HEENT: Pupils equal and reactive to light and accommodation.  NECK: Supple without thyromegaly.  LUNGS: Decreased breath sounds in both bases.  HEART: Normal JVP. Normal PMI. Regular rate and rhythm. Normal S1, S2. No appreciable gallop, murmur, or rub.  ABDOMEN: Soft and nontender without hepatosplenomegaly.  EXTREMITIES: 1 to 2+ bilateral pedal edema extending bilaterally up to both knees.  MUSCULOSKELETAL: Normal muscle tone.  NEUROLOGICAL: The patient is alert and oriented x 3. Motor and sensory both grossly intact.   IMPRESSION: A 81 year old gentleman with known coronary artery disease who presents with 3-week history of bronchitis with congestive heart failure, pulmonary edema, and pedal edema. The patient is not experiencing any chest pain. Troponin is negative. The patient has initially responded to IV furosemide with prompt diuresis and overall clinical improvement.   RECOMMENDATIONS: 1.  Agree with overall current therapy.  2.  Would defer full-dose anticoagulation.  3.  Continue diuresis.  4.  Review 2-D echocardiogram.  5.  Would defer further noninvasive or invasive cardiac evaluation at this time.  ____________________________ Marcina Millard,  MD ap:jcm D: 03/19/2013 17:04:39 ET T: 03/19/2013 19:12:53 ET JOB#: [id number]  cc: Marcina Millard, MD, <Dictator> Marcina Millard MD ELECTRONICALLY SIGNED 04/17/2013 12:29

## 2014-06-22 NOTE — Discharge Summary (Signed)
PATIENT NAME:  Jesus Grimes, Jesus Grimes MR#:  [id number] DATE OF BIRTH:  07/17/1931  DATE OF ADMISSION:  03/19/2013 DATE OF DISCHARGE:  03/22/2013  CONSULTING CARDIOLOGIST: Dr. Darrold JunkerParaschos.   DISCHARGE DIAGNOSES: 1. Acute diastolic congestive heart failure.  2. Severe aortic stenosis.  3. Acute renal failure.  4. Bronchitis.  5. Hypertension.   HISTORY OF PRESENT ILLNESS: Please see initial history and physical for details. Briefly, the patient is volume overloaded from diastolic CHF, as well as severe aortic stenosis.   HOSPITAL COURSE BY ISSUE:  1. Diastolic congestive heart failure. The patient was diuresed with IV Lasix. He seen by cardiology. Consideration may be given to TAVR in the future for his severe aortic stenosis.  2. Acute renal failure. This improved with diuresis.  3. Hypertension. Blood pressure was elevated, but he was continued on his amlodipine, benazepril, losartan and the Lasix. 4. Bronchitis. A sputum sample was checked. At discharge, the patient was growing gram-negative rod. He was discharged on levofloxacin.  5. Atrial fibrillation. The patient was continued on sotalol. There was consideration of restarting anticoagulation. He had a recent perinephric bleed within the last several months. He is also undergoing some upcoming procedures. Anticoagulation will be reconsidered as an outpatient.   DISCHARGE MEDICATIONS: Please see [facility] discharge instructions.   INSTRUCTIONS: Discharged to home. Low-sodium diet, regular consistency.   ACTIVITY: As tolerated.   Follow-up with Dr. Lady GaryFath within 1 to 2 weeks and Dr. Sampson GoonFitzgerald in 1 to 2 weeks. Discharge took 35 minutes.  ____________________________ Stann Mainlandavid P. Sampson GoonFitzgerald, MD dpf:sg D: 04/08/2013 07:20:00 ET T: 04/08/2013 09:18:33 ET JOB#: [id number]  cc: Stann Mainlandavid P. Sampson GoonFitzgerald, MD, <Dictator> Dewarren Sampson GoonFITZGERALD MD ELECTRONICALLY SIGNED 04/11/2013 20:46

## 2014-06-22 NOTE — H&P (Signed)
PATIENT NAME:  Jesus Grimes, Jesus Grimes MR#:  [id number] DATE OF BIRTH:  [birth year]  DATE OF ADMISSION:  03/19/2013  PRIMARY CARE PHYSICIAN: Dr. Sampson Goon.  REFERRING PHYSICIAN:  Dr. Zenda Alpers.     CHIEF COMPLAINT: Shortness of breath, cough, sputum for 3 weeks.   HISTORY OF PRESENT ILLNESS: A 81 year old Caucasian male with a history of Afib, CAD, hypertension, hyperlipidemia, presented to the ED with the above chief complaint. The patient is alert, awake, oriented, in no acute distress. The patient said he has long time leg edema, but worsening recently. He started to have cough, sputum, shortness of breath and wheezing 3 weeks ago. He was given antibiotics and stress steroids by Dr. Meredeth Ide. Cough, sputum is getting better, but leg edema has been worsening for the past in 1 week. In addition, the patient gained 10 pounds within 1 week. The patient denies any orthopnea or nocturnal dyspnea. Denies any fever or chills. No other symptoms except back pain.    PAST MEDICAL HISTORY:  1. Chronic atrial fibrillation, was on Coumadin, but was taken off Coumadin for the past 2 weeks according to Dr. America Brown recommendation.  2.  Hypertension.  3.  Hyperlipidemia.  4.  COPD.  5.  CKD. 6.  Hypothyroidism.   PAST SURGICAL HISTORY: CABG, lithotripsy, cardioversion for Afib, skin cancer resection.   SOCIAL HISTORY: No smoking or drinking or illicit drugs.   FAMILY HISTORY: Both parents deceased at a young age with unclear etiology.   ALLERGIES: AMBIEN AND MORPHINE.   HOME MEDICATIONS: 1.  Vitamin B12,  2500 mcg sublingual once a day.  2.  Ventolin HFA CFC free 90 mcg inhalation two puffs 4 times daily p.r.n.  3.  Flomax 0.4 mg p.o. daily.  4.  Sotalol 80 mg p.o. b.i.d.  5.  Pravastatin 80 mg p.o. at bedtime.  6.  Potassium chloride 10 mEq once a day.  7.  Ocuvite oral tablets once a day.  8.  Losartan 100 mg p.o. daily.  9.  Levothyroxine 125 mcg p.o. daily.  10.  Lasix 40 mg p.o. daily.  11.   Fluticasone nasal 50 mcg 1 spray once a day.  12.  Finasteride 5 mg p.o. daily at bedtime.  13.  Benazepril 20 mg p.o. daily.  14.  Aspirin 81 mg p.o. daily.  15.  Norvasc 5 mg p.o. at bedtime once a day.    REVIEW OF SYSTEMS:   CONSTITUTIONAL: The patient denies any fever or chills. No headache or dizziness, but has weakness and weight gain of 10 pounds within 1 week.  EYES: No double vision or blurred vision.  ENT: No postnasal drip, slurred speech or dysphagia.  CARDIOVASCULAR: No chest pain, palpitation, orthopnea or nocturnal dyspnea, but has leg edema.  PULMONARY: Positive for cough, sputum, shortness of breath, wheezing, but no hemoptysis.  GASTROINTESTINAL: No abdominal pain, nausea, vomiting, diarrhea. No melena or bloody stool.  GENITOURINARY: No dysuria, hematuria or incontinence.  SKIN: No rash or jaundice.  NEUROLOGIC: No syncope, loss of consciousness or seizure.  HEMATOLOGY: No easy bruising or bleeding.  ENDOCRINE: No polyuria or polydipsia, heat or cold intolerance.   PHYSICAL EXAMINATION: VITAL SIGNS: Temperature 98.2, blood pressure 171/91, pulse 61, respirations 20, O2 saturation 94% on oxygen by nasal cannula.  GENERAL: The patient is alert, awake, oriented, in no acute distress.  HEENT: Pupils round, equal and reactive to light and accommodation. Moist oral mucosa. Clear oropharynx.  NECK: Supple. No JVD or carotid bruits. No lymphadenopathy. No thyromegaly.  CARDIOVASCULAR: S1,  S2, irregular rate and rhythm. No murmurs, gallops.  PULMONARY: Bilateral air entry. Mild wheezing with rales on both bases. No use of accessory muscle to breathe.  ABDOMEN: Soft, obese. No distention or tenderness. No organomegaly. Bowel sounds present.  EXTREMITIES: Bilateral leg edema, 3+. No clubbing or cyanosis. No calf tenderness.  SKIN: No rash or jaundice.  NEUROLOGIC: A and O x 3. No focal deficit. Power 5/5. Sensation intact. Difficult to estimate whether the patient has pedal  pulses due to edema.   IMAGING, DIAGNOSTICS AND LABORATORY DATA: Troponin 0.03 for 2 sets.   Chest x-ray showed bibasilar airspace opacities with underlying vascular congestion.   WBC 13.8, hemoglobin 11.8, platelets 106.   Glucose 108, BUN 24, creatinine 1.65, sodium 132, potassium 3.7, chloride 96, bicarb of 34. BNP 6762, 62.   EKG showed Afib at 63 bpm.  IMPRESSIONS: 1.  Acute congestive heart failure.  2.  Acute renal failure on chronic kidney disease.  3.  Hyponatremia.  4.  Thrombocytopenia.  5.  Coronary artery disease.  6.  Hypertension.  7.  Hypothyroidism.   PLAN OF TREATMENT: 1. The patient will be admitted to tele floor. We will continue O2 by nasal cannula, start CHF protocol, start Lasix IV 40 mg q.8 hours, but we will closely monitor BMP and adjust Lasix dose.  2.  Will get an echocardiograph and follow up troponin level.  3.  We will get cardiology consult from Dr. Lady GaryFath.  4.  For Afib, we will continue aspirin, sotalol. Follow up with Dr. Lady GaryFath for recommendations.  5.  Continue the patient's home hypertension medication.   I discussed the patient's condition and plan of treatment with the patient and the patient's wife. The patient wants FULL CODE at this time.   TIME SPENT: About 62 minutes.    ____________________________ Shaune PollackQing Oskar Cretella, MD qc:dmm D: 03/19/2013 09:10:04 ET T: 03/19/2013 11:06:43 ET JOB#: [id number]  cc: Shaune PollackQing Macedonio Scallon, MD, <Dictator> Shaune PollackQING Kadisha Goodine MD ELECTRONICALLY SIGNED 03/19/2013 18:04

## 2015-11-24 IMAGING — CT CT CHEST W/O CM
1 series · 15 of 31 positions shown, 19 images · non-contrast
Comparison: Chest radiograph 01/17/2013

CLINICAL DATA: Left pleural effusion, 1 L of fluid drained ten days
ago, history of skin cancer, CABG

EXAM:
CT CHEST WITHOUT CONTRAST
TECHNIQUE: Multidetector CT imaging of the chest was performed following the
standard protocol without IV contrast. Sagittal and coronal MPR
images reconstructed from axial data set.

[Series 2: routine chest wo · axial · 0.78mm/px · z∈[-658,-328]mm · 15 of 72 slices shown, 19 images]
[im 3/72  mediastinal]
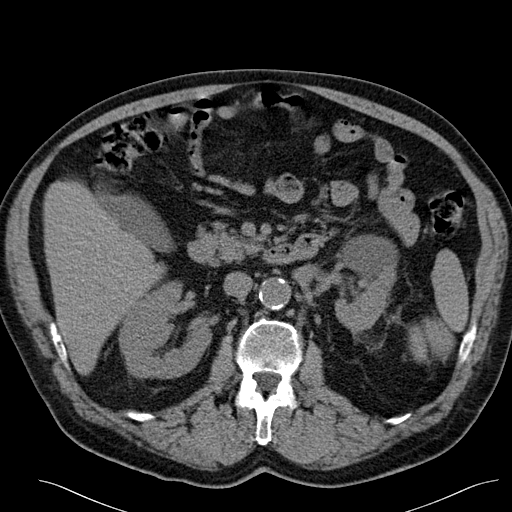
[im 3/72  lung]
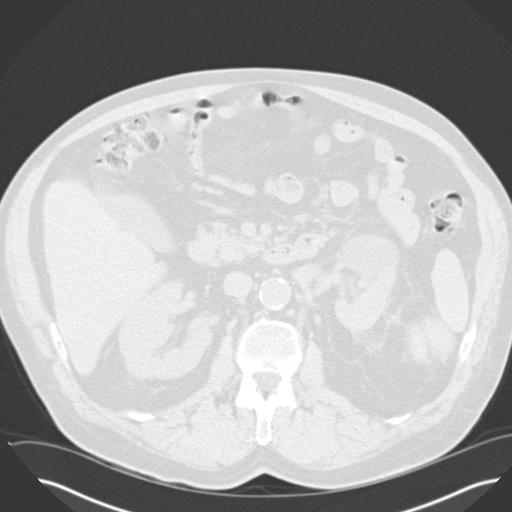
[im 8/72  lung]
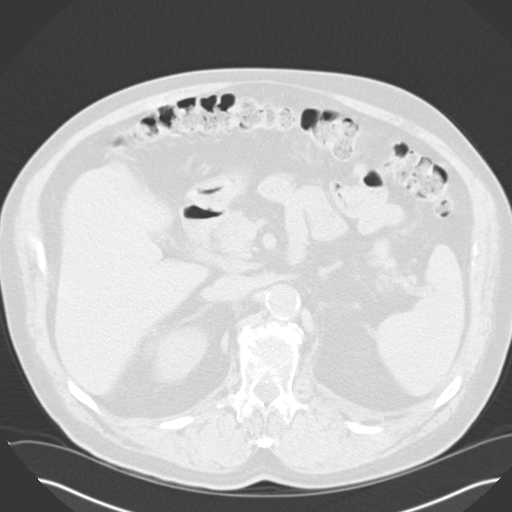
[im 14/72  lung]
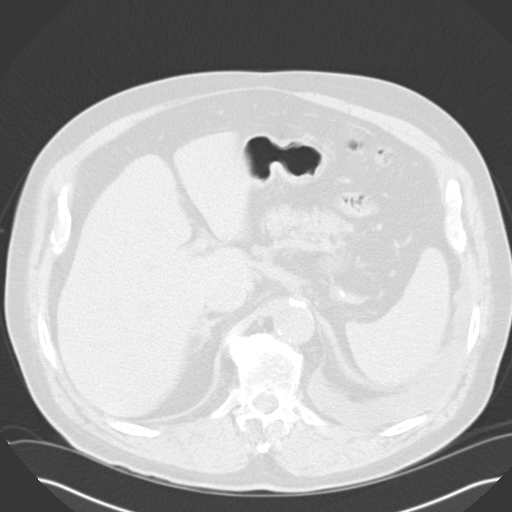
[im 16/72  lung]
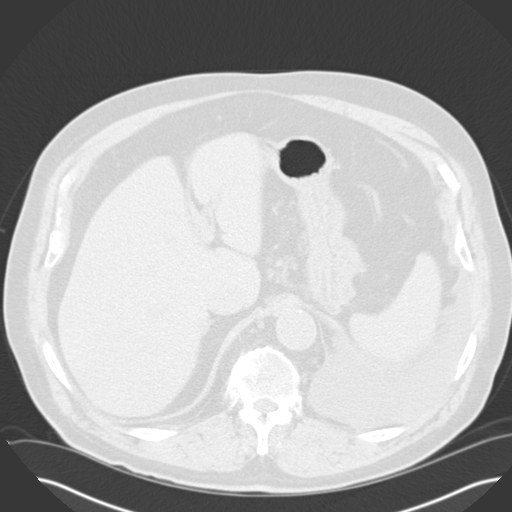
[im 22/72  mediastinal]
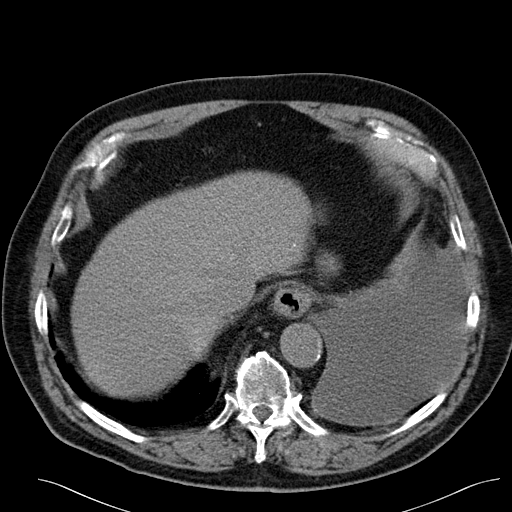
[im 22/72  lung]
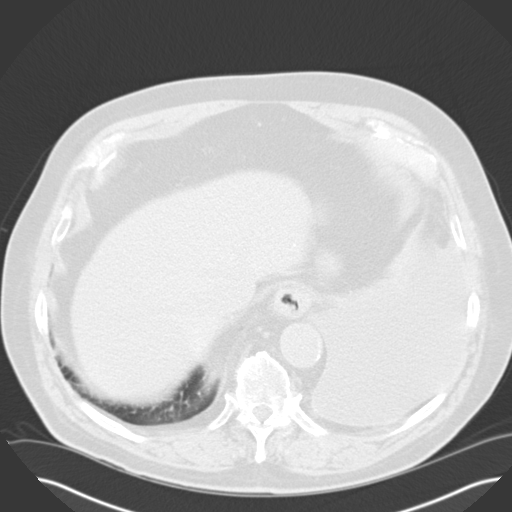
[im 27/72  lung]
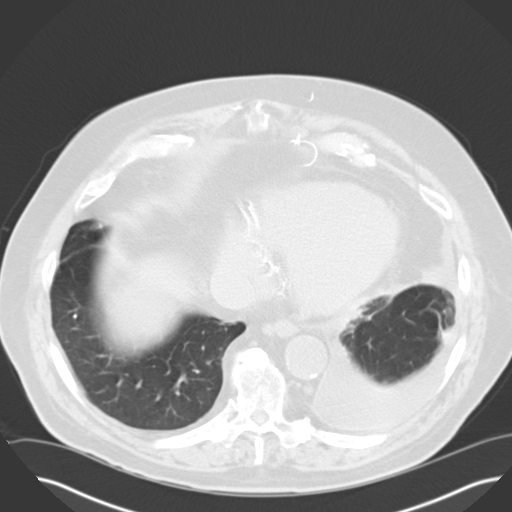
[im 32/72  lung]
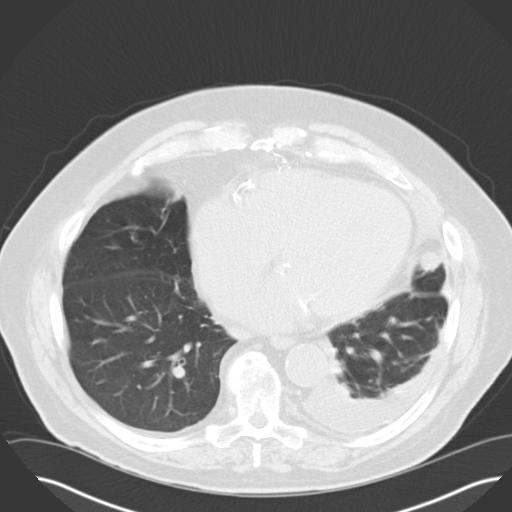
[im 37/72  lung]
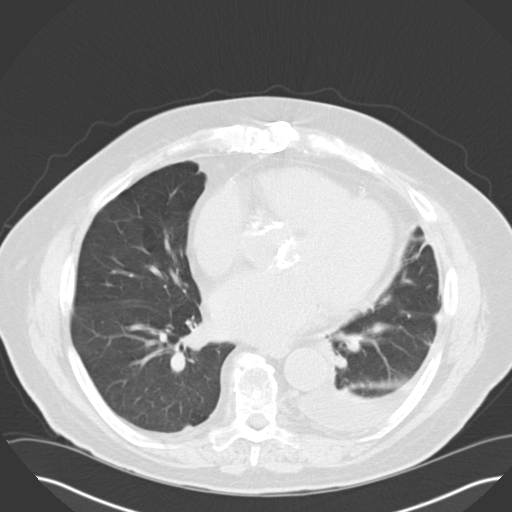
[im 40/72  mediastinal]
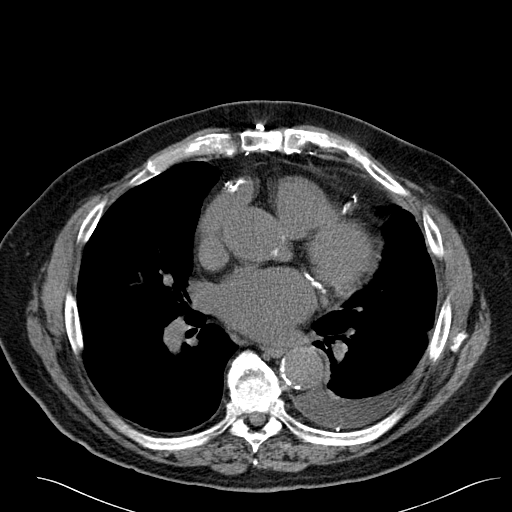
[im 40/72  lung]
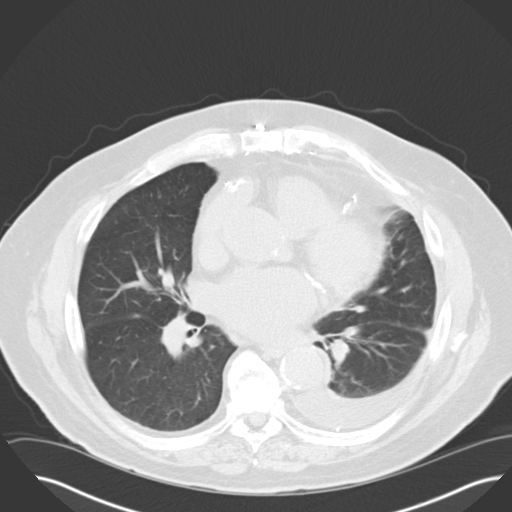
[im 45/72  lung]
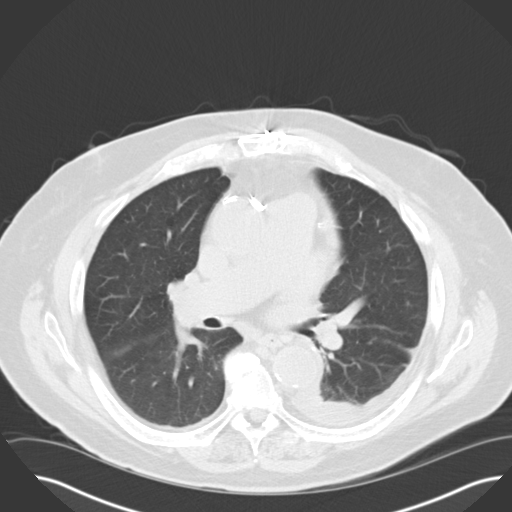
[im 50/72  lung]
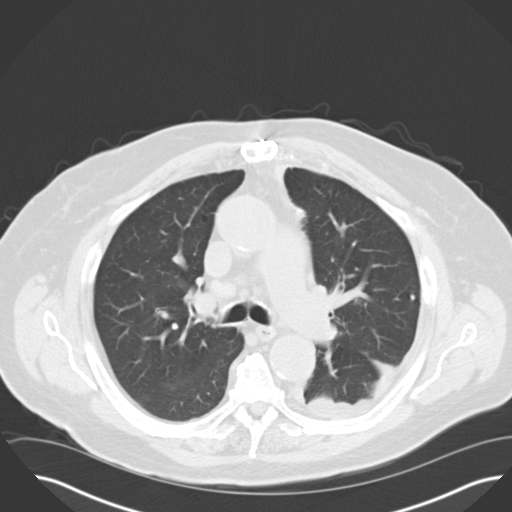
[im 56/72  lung]
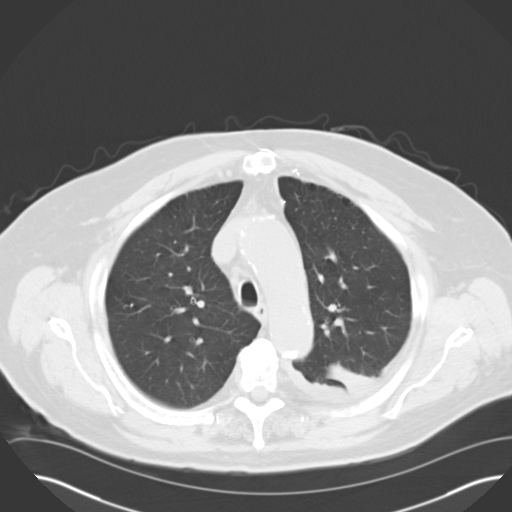
[im 58/72  mediastinal]
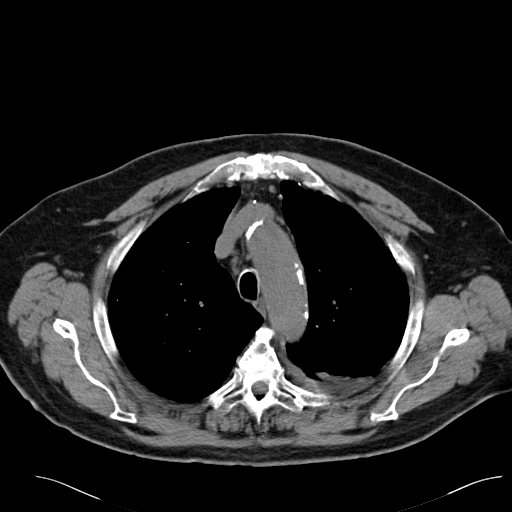
[im 58/72  lung]
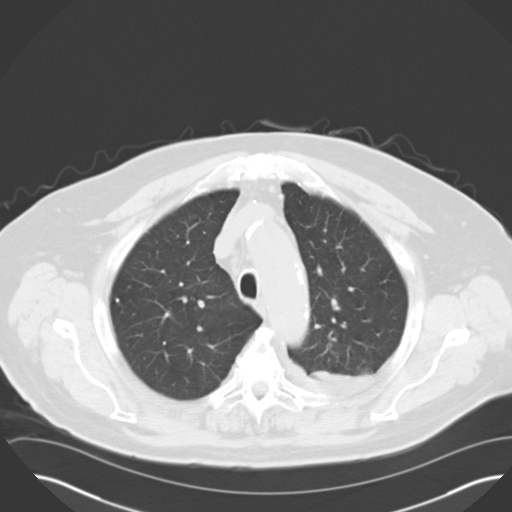
[im 64/72  lung]
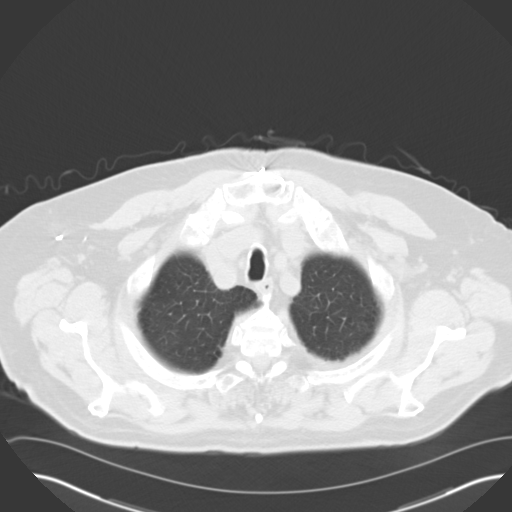
[im 69/72  lung]
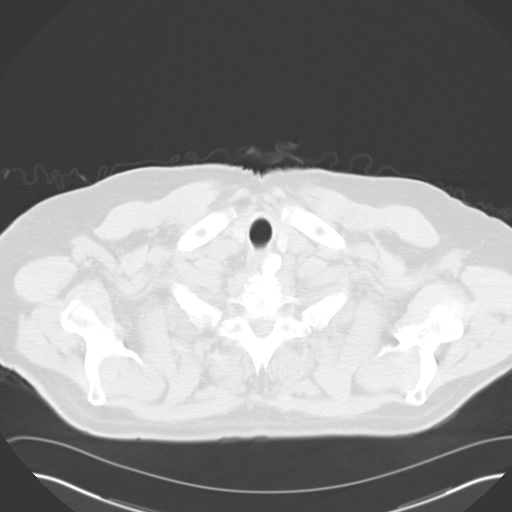

[15 of 31 positions shown; findings below may reference images not displayed]

FINDINGS: Scattered atherosclerotic calcifications aorta and coronary arteries
with post CABG.

Mitral annular and aortic valvular calcifications.

Cyst at upper to mid left kidney 4.8 x 3.4 cm image 71.

Upper normal sized precarinal lymph node.

Additional scattered normal size mediastinal lymph nodes.

Surgical clips right axilla question prior lymph node dissection.

Enlargement of cardiac chambers.

Left pleural effusion, minimally loculated superiorly/posteriorly.

Multilevel endplate spur formation thoracic spine.

Scattered calcified pulmonary granulomata.

Compressive atelectasis in left lower lobe.

Additional area of rounded opacity at the anterior base of the
lingula, favor atelectasis though it is difficult to exclude a
pulmonary nodule 13 x 16 x 13 mm in size.

Noncalcified 6 mm diameter right upper lobe nodule image 14.

No infiltrate or pneumothorax.

Bones appear demineralized.
IMPRESSION: Persistent small left pleural effusion.

Mild compressive atelectasis left lower lobe.

Old granulomatous disease.

Extensive atherosclerotic disease.

Questionable area of rounded atelectasis versus nodule at base of
lingula, 13 x 16 x 13 mm in size.

Short-term followup CT imaging recommended in 3-4 months to
reassess.

Alternatively, this could potentially be evaluated by PET-CT.

Noncalcified 6 mm diameter right upper lobe pulmonary nodule, too
small for PET assessment, recommendation below.

If the patient is at high risk for bronchogenic carcinoma, follow-up
chest CT at 6-12 months is recommended. If the patient is at low
risk for bronchogenic carcinoma, follow-up chest CT at 12 months is
recommended. This recommendation follows the consensus statement:
Guidelines for Management of Small Pulmonary Nodules Detected on CT
Scans: A Statement from the [HOSPITAL] as published in
